# Patient Record
Sex: Female | Born: 1945
Health system: Southern US, Community
[De-identification: ages and names within clinical notes are randomized; demographics above are authoritative.]

## PROBLEM LIST (undated history)

## (undated) DIAGNOSIS — E785 Hyperlipidemia, unspecified: Secondary | ICD-10-CM

## (undated) DIAGNOSIS — F209 Schizophrenia, unspecified: Secondary | ICD-10-CM

## (undated) DIAGNOSIS — E119 Type 2 diabetes mellitus without complications: Secondary | ICD-10-CM

## (undated) DIAGNOSIS — I10 Essential (primary) hypertension: Secondary | ICD-10-CM

## (undated) DIAGNOSIS — N83209 Unspecified ovarian cyst, unspecified side: Secondary | ICD-10-CM

## (undated) HISTORY — PX: ABDOMINAL HYSTERECTOMY: SHX81

## (undated) HISTORY — PX: FRACTURE SURGERY: SHX138

## (undated) HISTORY — PX: TUBAL LIGATION: SHX77

---

## 2003-06-02 ENCOUNTER — Encounter: Payer: Self-pay | Admitting: Internal Medicine

## 2003-06-02 ENCOUNTER — Ambulatory Visit (HOSPITAL_COMMUNITY): Admission: RE | Admit: 2003-06-02 | Discharge: 2003-06-02 | Payer: Self-pay | Admitting: Internal Medicine

## 2005-05-05 ENCOUNTER — Ambulatory Visit (HOSPITAL_COMMUNITY): Admission: RE | Admit: 2005-05-05 | Discharge: 2005-05-05 | Payer: Self-pay | Admitting: Internal Medicine

## 2006-05-15 ENCOUNTER — Ambulatory Visit (HOSPITAL_COMMUNITY): Admission: RE | Admit: 2006-05-15 | Discharge: 2006-05-15 | Payer: Self-pay | Admitting: Internal Medicine

## 2006-07-10 ENCOUNTER — Ambulatory Visit: Payer: Self-pay | Admitting: Gastroenterology

## 2006-07-10 ENCOUNTER — Ambulatory Visit (HOSPITAL_COMMUNITY): Admission: RE | Admit: 2006-07-10 | Discharge: 2006-07-10 | Payer: Self-pay | Admitting: Gastroenterology

## 2007-08-21 ENCOUNTER — Ambulatory Visit (HOSPITAL_COMMUNITY): Admission: RE | Admit: 2007-08-21 | Discharge: 2007-08-21 | Payer: Self-pay | Admitting: Internal Medicine

## 2010-10-26 ENCOUNTER — Ambulatory Visit (HOSPITAL_COMMUNITY)
Admission: RE | Admit: 2010-10-26 | Discharge: 2010-10-26 | Payer: Self-pay | Source: Home / Self Care | Attending: Internal Medicine | Admitting: Internal Medicine

## 2010-11-14 ENCOUNTER — Encounter: Payer: Self-pay | Admitting: Internal Medicine

## 2010-11-15 ENCOUNTER — Encounter: Payer: Self-pay | Admitting: Internal Medicine

## 2011-03-11 NOTE — Op Note (Signed)
NAMEGARYN, Ellen Mcmillan               ACCOUNT NO.:  [id number]   MEDICAL RECORD NO.:  [id number]          PATIENT TYPE:  AMB   LOCATION:  DAY                           FACILITY:  [facility]   PHYSICIAN:  Kassie Mends, M.D.      DATE OF BIRTH:  01-11-1946   DATE OF PROCEDURE:  07/10/2006  DATE OF DISCHARGE:  07/10/2006                                 OPERATIVE REPORT   PROCEDURE:  Colonoscopy.   INDICATION FOR EXAM:  Ellen Mcmillan is a 65 year old female who presents for  average risk colon cancer screening.   FINDINGS:  1. Normal colon.  No polyps, inflammatory changes, or vascular ectasia      seen.  No diverticula evident.  2. Normal retroflexed view of the rectum.   RECOMMENDATIONS:  1. Screening colonoscopy in ten years.  2. Follow up with Dr. Felecia Mcmillan.   MEDICATIONS:  1. Demerol 50 mg IV.  2. Versed 3 mg IV.   PROCEDURE TECHNIQUE:  Physical exam was performed.  Informed consent was  obtained from the patient after explaining the benefits, risks and  alternatives to the procedure.  The patient was connected to the monitor and  placed in the left lateral position.  Continuous oxygen was provided by  nasal cannula and IV medicine administered via indwelling cannula.  After  administration of sedation and rectal exam, the patient's rectum was  intubated and the scope was advanced under direct visualization to the  cecum.  The scope was removed slowly by carefully examining the color,  texture, anatomy and integrity of the mucosa on the way out.  The patient  was recovered in the endoscopy suite and discharged home in satisfactory  condition.      Kassie Mends, M.D.  Electronically Signed     SM/MEDQ  D:  07/11/2006  T:  07/12/2006  Job:  [id number]   cc:   Ellen D. Felecia Shelling, MD  Fax: [phone]

## 2013-03-07 DIAGNOSIS — I10 Essential (primary) hypertension: Secondary | ICD-10-CM | POA: Diagnosis not present

## 2013-10-01 ENCOUNTER — Other Ambulatory Visit (HOSPITAL_COMMUNITY): Payer: Self-pay | Admitting: Internal Medicine

## 2013-10-01 DIAGNOSIS — I10 Essential (primary) hypertension: Secondary | ICD-10-CM | POA: Diagnosis not present

## 2013-10-01 DIAGNOSIS — E7800 Pure hypercholesterolemia, unspecified: Secondary | ICD-10-CM | POA: Diagnosis not present

## 2013-10-01 DIAGNOSIS — Z139 Encounter for screening, unspecified: Secondary | ICD-10-CM

## 2013-10-08 ENCOUNTER — Ambulatory Visit (HOSPITAL_COMMUNITY)
Admission: RE | Admit: 2013-10-08 | Discharge: 2013-10-08 | Disposition: A | Discharge status: Home / Self Care | Payer: Medicare Other | Source: Ambulatory Visit | Attending: Internal Medicine | Admitting: Internal Medicine

## 2013-10-08 DIAGNOSIS — Z1231 Encounter for screening mammogram for malignant neoplasm of breast: Secondary | ICD-10-CM | POA: Insufficient documentation

## 2013-10-08 DIAGNOSIS — Z139 Encounter for screening, unspecified: Secondary | ICD-10-CM

## 2013-10-10 DIAGNOSIS — Z23 Encounter for immunization: Secondary | ICD-10-CM | POA: Diagnosis not present

## 2014-02-22 DIAGNOSIS — R7309 Other abnormal glucose: Secondary | ICD-10-CM | POA: Diagnosis not present

## 2014-02-22 DIAGNOSIS — Z0000 Encounter for general adult medical examination without abnormal findings: Secondary | ICD-10-CM | POA: Diagnosis not present

## 2014-02-22 DIAGNOSIS — E785 Hyperlipidemia, unspecified: Secondary | ICD-10-CM | POA: Diagnosis not present

## 2014-02-22 DIAGNOSIS — R799 Abnormal finding of blood chemistry, unspecified: Secondary | ICD-10-CM | POA: Diagnosis not present

## 2014-02-22 DIAGNOSIS — F209 Schizophrenia, unspecified: Secondary | ICD-10-CM | POA: Diagnosis not present

## 2014-03-03 DIAGNOSIS — E7800 Pure hypercholesterolemia, unspecified: Secondary | ICD-10-CM | POA: Diagnosis not present

## 2014-03-03 DIAGNOSIS — I10 Essential (primary) hypertension: Secondary | ICD-10-CM | POA: Diagnosis not present

## 2014-09-01 DIAGNOSIS — I10 Essential (primary) hypertension: Secondary | ICD-10-CM | POA: Diagnosis not present

## 2014-09-01 DIAGNOSIS — E784 Other hyperlipidemia: Secondary | ICD-10-CM | POA: Diagnosis not present

## 2014-09-01 DIAGNOSIS — Z23 Encounter for immunization: Secondary | ICD-10-CM | POA: Diagnosis not present

## 2014-09-01 DIAGNOSIS — F209 Schizophrenia, unspecified: Secondary | ICD-10-CM | POA: Diagnosis not present

## 2015-03-02 DIAGNOSIS — F209 Schizophrenia, unspecified: Secondary | ICD-10-CM | POA: Diagnosis not present

## 2015-03-02 DIAGNOSIS — E784 Other hyperlipidemia: Secondary | ICD-10-CM | POA: Diagnosis not present

## 2015-03-02 DIAGNOSIS — I10 Essential (primary) hypertension: Secondary | ICD-10-CM | POA: Diagnosis not present

## 2015-05-05 ENCOUNTER — Other Ambulatory Visit (HOSPITAL_COMMUNITY): Payer: Self-pay | Admitting: Internal Medicine

## 2015-05-05 DIAGNOSIS — Z1231 Encounter for screening mammogram for malignant neoplasm of breast: Secondary | ICD-10-CM

## 2015-05-15 ENCOUNTER — Other Ambulatory Visit (HOSPITAL_COMMUNITY): Payer: Self-pay | Admitting: Internal Medicine

## 2015-05-15 ENCOUNTER — Ambulatory Visit (HOSPITAL_COMMUNITY)
Admission: RE | Admit: 2015-05-15 | Discharge: 2015-05-15 | Disposition: A | Discharge status: Home / Self Care | Payer: Medicare Other | Source: Ambulatory Visit | Attending: Internal Medicine | Admitting: Internal Medicine

## 2015-05-15 DIAGNOSIS — Z1231 Encounter for screening mammogram for malignant neoplasm of breast: Secondary | ICD-10-CM

## 2015-08-31 DIAGNOSIS — E785 Hyperlipidemia, unspecified: Secondary | ICD-10-CM | POA: Diagnosis not present

## 2015-08-31 DIAGNOSIS — F209 Schizophrenia, unspecified: Secondary | ICD-10-CM | POA: Diagnosis not present

## 2015-08-31 DIAGNOSIS — E784 Other hyperlipidemia: Secondary | ICD-10-CM | POA: Diagnosis not present

## 2015-08-31 DIAGNOSIS — Z23 Encounter for immunization: Secondary | ICD-10-CM | POA: Diagnosis not present

## 2015-08-31 DIAGNOSIS — I10 Essential (primary) hypertension: Secondary | ICD-10-CM | POA: Diagnosis not present

## 2016-02-29 DIAGNOSIS — F209 Schizophrenia, unspecified: Secondary | ICD-10-CM | POA: Diagnosis not present

## 2016-02-29 DIAGNOSIS — I10 Essential (primary) hypertension: Secondary | ICD-10-CM | POA: Diagnosis not present

## 2016-02-29 DIAGNOSIS — E784 Other hyperlipidemia: Secondary | ICD-10-CM | POA: Diagnosis not present

## 2016-03-08 ENCOUNTER — Encounter: Payer: Self-pay | Admitting: Obstetrics and Gynecology

## 2016-03-10 ENCOUNTER — Encounter: Payer: Self-pay | Admitting: *Deleted

## 2016-03-10 ENCOUNTER — Encounter: Payer: Self-pay | Admitting: Obstetrics and Gynecology

## 2016-05-24 ENCOUNTER — Telehealth: Payer: Self-pay | Admitting: Gastroenterology

## 2016-05-24 NOTE — Telephone Encounter (Signed)
Recall for tcs °

## 2016-05-25 NOTE — Telephone Encounter (Signed)
Letter mail

## 2016-07-04 DIAGNOSIS — E784 Other hyperlipidemia: Secondary | ICD-10-CM | POA: Diagnosis not present

## 2016-07-04 DIAGNOSIS — I10 Essential (primary) hypertension: Secondary | ICD-10-CM | POA: Diagnosis not present

## 2016-07-04 DIAGNOSIS — Z23 Encounter for immunization: Secondary | ICD-10-CM | POA: Diagnosis not present

## 2016-07-04 DIAGNOSIS — F209 Schizophrenia, unspecified: Secondary | ICD-10-CM | POA: Diagnosis not present

## 2016-07-27 DIAGNOSIS — I10 Essential (primary) hypertension: Secondary | ICD-10-CM | POA: Diagnosis not present

## 2016-07-27 DIAGNOSIS — F209 Schizophrenia, unspecified: Secondary | ICD-10-CM | POA: Diagnosis not present

## 2016-07-27 DIAGNOSIS — R140 Abdominal distension (gaseous): Secondary | ICD-10-CM | POA: Diagnosis not present

## 2016-08-19 ENCOUNTER — Telehealth: Payer: Self-pay

## 2016-08-19 DIAGNOSIS — B370 Candidal stomatitis: Secondary | ICD-10-CM | POA: Diagnosis not present

## 2016-08-19 DIAGNOSIS — F209 Schizophrenia, unspecified: Secondary | ICD-10-CM | POA: Diagnosis not present

## 2016-08-19 DIAGNOSIS — I10 Essential (primary) hypertension: Secondary | ICD-10-CM | POA: Diagnosis not present

## 2016-08-24 NOTE — Telephone Encounter (Signed)
Ok to scheduled

## 2016-08-24 NOTE — Telephone Encounter (Signed)
Gastroenterology Pre-Procedure Review  Request Date: 08/19/2016 Requesting Physician: Dr. Felecia ShellingFanta  PATIENT REVIEW QUESTIONS: The patient responded to the following health history questions as indicated:    1. Diabetes Melitis: no 2. Joint replacements in the past 12 months: no 3. Major health problems in the past 3 months: no 4. Has an artificial valve or MVP: no 5. Has a defibrillator: no 6. Has been advised in past to take antibiotics in advance of a procedure like teeth cleaning: no 7. Family history of colon cancer: no  8. Alcohol Use: no 9. History of sleep apnea: no  10. History of coronary artery or other vascular stents placed within the last 12 months: no    MEDICATIONS & ALLERGIES:    Patient reports the following regarding taking any blood thinners:   Plavix? no Aspirin? no Coumadin? no Brilinta? no Xarelto? no Eliquis? no Pradaxa? no Savaysa? no Effient? no  Patient confirms/reports the following medications:  Current Outpatient Prescriptions  Medication Sig Dispense Refill  . acetaminophen (TYLENOL) 325 MG tablet Take 650 mg by mouth every 6 (six) hours as needed.    Marland Kitchen. amLODipine (NORVASC) 10 MG tablet Take 10 mg by mouth daily.    Marland Kitchen. atenolol (TENORMIN) 50 MG tablet Take 50 mg by mouth daily.    Marland Kitchen. docusate sodium (COLACE) 100 MG capsule Take 100 mg by mouth 2 (two) times daily.    Marland Kitchen. losartan-hydrochlorothiazide (HYZAAR) 100-25 MG tablet Take 1 tablet by mouth daily.    . Multiple Vitamin (MULTIVITAMIN) tablet Take 1 tablet by mouth daily.    . simvastatin (ZOCOR) 20 MG tablet Take 20 mg by mouth daily.    . ziprasidone (GEODON) 60 MG capsule Take 60 mg by mouth 2 (two) times daily with a meal.     No current facility-administered medications for this visit.     Patient confirms/reports the following allergies:  No Known Allergies  No orders of the defined types were placed in this encounter.   AUTHORIZATION INFORMATION Primary Insurance: ID #:  Group #:   Pre-Cert / Auth required:  Pre-Cert / Auth #:   Secondary Insurance:   ID #: Group #:  Pre-Cert / Auth required: Pre-Cert / Auth #:   SCHEDULE INFORMATION: Procedure has been scheduled as follows:  Date: 09/07/2016                         Time:  1:00 PM Location: [facility] Short Stay  This Gastroenterology Pre-Precedure Review Form is being routed to the following provider(s): R. Roetta SessionsMichael Rourk, MD

## 2016-08-24 NOTE — Telephone Encounter (Signed)
Received a call from Dr. Letitia NeriFanta's office to set up this pt for colonoscopy while she was at their office.   I spoke to ChinaBenita and Ida and got most of the information from the caregiver, Hermine MessickMelissa Sheffield, who si the daughter in law of the pt and owns the facility that the pt resides in.  PT does not have a POA, Melissa said that the pt signs for herself, but Efraim KaufmannMelissa said that she will be able to be with the pt at the hospital.

## 2016-08-25 NOTE — Telephone Encounter (Signed)
I called to speak to [facility] whom I spoke to when she had the pt at Dr. Letitia NeriFanta's.  I will need to change the appt from 11/15/ to 11/16. Melissa was not in and [name] answered the phone ( Melissa's mother). She refused to give me a phone number that I could contact Melissa. She kept telling me that we needed to check the insurance and I told her that they need to check the insurance. I asked her to have Melissa call me and she never said that she would, only wanted my name.

## 2016-09-02 NOTE — Telephone Encounter (Signed)
I called to [facility] to [facility] in reference to the date of the procedure. I need to reschedule and Ellen's mom would not give me a number to call Ellen. So I asked her to tell Ellen that the appt for the colonoscopy is cancelled and to please call me to reschedule. She said she gave her the message the last time, but I have not heard from her.

## 2016-09-02 NOTE — Telephone Encounter (Signed)
Forwarding to Leslie Lewis, PA for FYI.  

## 2016-09-06 NOTE — Telephone Encounter (Signed)
I called and spoke to IDA and she is aware pt is not scheduled for colonoscopy now.

## 2016-09-06 NOTE — Telephone Encounter (Signed)
Noted. Please make PCP aware that caregiver's will not return call and patient currently not on schedule for TCS.

## 2016-09-19 ENCOUNTER — Telehealth (HOSPITAL_COMMUNITY): Payer: Self-pay | Admitting: *Deleted

## 2016-09-19 NOTE — Telephone Encounter (Signed)
attempt to reach patient regarding an appointment. line busy.

## 2016-09-26 ENCOUNTER — Telehealth: Payer: Self-pay

## 2016-09-26 NOTE — Telephone Encounter (Signed)
[phone] please call to schedule tcs, received letter

## 2016-10-03 ENCOUNTER — Other Ambulatory Visit (HOSPITAL_COMMUNITY): Payer: Self-pay | Admitting: Internal Medicine

## 2016-10-03 DIAGNOSIS — E784 Other hyperlipidemia: Secondary | ICD-10-CM | POA: Diagnosis not present

## 2016-10-03 DIAGNOSIS — I10 Essential (primary) hypertension: Secondary | ICD-10-CM | POA: Diagnosis not present

## 2016-10-03 DIAGNOSIS — Z780 Asymptomatic menopausal state: Secondary | ICD-10-CM

## 2016-10-03 DIAGNOSIS — F209 Schizophrenia, unspecified: Secondary | ICD-10-CM | POA: Diagnosis not present

## 2016-10-03 DIAGNOSIS — Z23 Encounter for immunization: Secondary | ICD-10-CM | POA: Diagnosis not present

## 2016-10-03 NOTE — Telephone Encounter (Signed)
Tried to call and line was busy.  

## 2016-10-04 NOTE — Telephone Encounter (Signed)
I called and left message with [name] to have Ellen Mcmillan to call me to give triage info and med list for pt to be triaged for the colonoscopy.   She said she will do so.

## 2016-10-05 NOTE — Telephone Encounter (Signed)
[facility] call with med and insurance information to schedule the colonoscopy.

## 2016-10-11 ENCOUNTER — Other Ambulatory Visit (HOSPITAL_COMMUNITY): Payer: Medicare Other

## 2016-10-13 ENCOUNTER — Emergency Department (HOSPITAL_COMMUNITY)
Admission: EM | Admit: 2016-10-13 | Discharge: 2016-10-13 | Disposition: A | Discharge status: Home / Self Care | Payer: Medicare Other | Attending: Emergency Medicine | Admitting: Emergency Medicine

## 2016-10-13 ENCOUNTER — Encounter (HOSPITAL_COMMUNITY): Payer: Self-pay | Admitting: Emergency Medicine

## 2016-10-13 DIAGNOSIS — F419 Anxiety disorder, unspecified: Secondary | ICD-10-CM | POA: Insufficient documentation

## 2016-10-13 DIAGNOSIS — Z79899 Other long term (current) drug therapy: Secondary | ICD-10-CM | POA: Insufficient documentation

## 2016-10-13 DIAGNOSIS — I10 Essential (primary) hypertension: Secondary | ICD-10-CM | POA: Diagnosis not present

## 2016-10-13 DIAGNOSIS — E119 Type 2 diabetes mellitus without complications: Secondary | ICD-10-CM | POA: Diagnosis not present

## 2016-10-13 DIAGNOSIS — R102 Pelvic and perineal pain: Secondary | ICD-10-CM | POA: Insufficient documentation

## 2016-10-13 DIAGNOSIS — R4182 Altered mental status, unspecified: Secondary | ICD-10-CM | POA: Diagnosis present

## 2016-10-13 HISTORY — DX: Essential (primary) hypertension: I10

## 2016-10-13 HISTORY — DX: Unspecified ovarian cyst, unspecified side: N83.209

## 2016-10-13 HISTORY — DX: Schizophrenia, unspecified: F20.9

## 2016-10-13 HISTORY — DX: Type 2 diabetes mellitus without complications: E11.9

## 2016-10-13 LAB — COMPREHENSIVE METABOLIC PANEL
ALT: 31 U/L (ref 14–54)
ANION GAP: 11 (ref 5–15)
AST: 45 U/L — ABNORMAL HIGH (ref 15–41)
Albumin: 4.5 g/dL (ref 3.5–5.0)
Alkaline Phosphatase: 67 U/L (ref 38–126)
BUN: 11 mg/dL (ref 6–20)
CHLORIDE: 102 mmol/L (ref 101–111)
CO2: 26 mmol/L (ref 22–32)
Calcium: 9.7 mg/dL (ref 8.9–10.3)
Creatinine, Ser: 0.89 mg/dL (ref 0.44–1.00)
Glucose, Bld: 105 mg/dL — ABNORMAL HIGH (ref 65–99)
POTASSIUM: 3.5 mmol/L (ref 3.5–5.1)
SODIUM: 139 mmol/L (ref 135–145)
Total Bilirubin: 0.9 mg/dL (ref 0.3–1.2)
Total Protein: 8.2 g/dL — ABNORMAL HIGH (ref 6.5–8.1)

## 2016-10-13 LAB — CBC
HEMATOCRIT: 39.4 % (ref 36.0–46.0)
HEMOGLOBIN: 13.2 g/dL (ref 12.0–15.0)
MCH: 31.7 pg (ref 26.0–34.0)
MCHC: 33.5 g/dL (ref 30.0–36.0)
MCV: 94.5 fL (ref 78.0–100.0)
PLATELETS: 331 10*3/uL (ref 150–400)
RBC: 4.17 MIL/uL (ref 3.87–5.11)
RDW: 12.1 % (ref 11.5–15.5)
WBC: 8.4 10*3/uL (ref 4.0–10.5)

## 2016-10-13 LAB — URINALYSIS, ROUTINE W REFLEX MICROSCOPIC
Bilirubin Urine: NEGATIVE
GLUCOSE, UA: NEGATIVE mg/dL
Hgb urine dipstick: NEGATIVE
Ketones, ur: NEGATIVE mg/dL
LEUKOCYTES UA: NEGATIVE
Nitrite: NEGATIVE
PH: 7 (ref 5.0–8.0)
PROTEIN: NEGATIVE mg/dL
SPECIFIC GRAVITY, URINE: 1.006 (ref 1.005–1.030)

## 2016-10-13 MED ORDER — HYDROXYZINE HCL 25 MG PO TABS: 25.0000 mg | ORAL_TABLET | Freq: Four times a day (QID) | ORAL | 0 refills | Status: DC

## 2016-10-13 MED ORDER — HYDROXYZINE HCL 25 MG PO TABS
25.0000 mg | ORAL_TABLET | Freq: Once | ORAL | Status: AC
  Administered 2016-10-13: 25 mg via ORAL
  Filled 2016-10-13: qty 1

## 2016-10-13 NOTE — ED Provider Notes (Signed)
[facility]-EMERGENCY DEPT Provider Note   CSN: [id number] Arrival date & time: 10/13/16  1912  By signing my name below, I, Nelwyn SalisburyJoshua Fowler, attest that this documentation has been prepared under the direction and in the presence of Mancel BaleElliott Benjimen Kelley, MD . Electronically Signed: Nelwyn SalisburyJoshua Fowler, Scribe. 10/13/2016. 9:11 PM.  History   Chief Complaint Chief Complaint  Patient presents with  . Altered Mental Status   The history is provided by the patient, a relative and a caregiver. No language interpreter was used.    HPI Comments:  Ellen RenshawHelen M Mcmillan is a 70 y.o. female with pmhx of DM, HTN, and schizophrenia who presents to the Emergency Department with ex-daughter-in-law, her caretaker, complaining of gradual onset constant worsening changes in mental status beginning a few days ago. Pt's relative states that she is an Production designer, theatre/television/filmadministrator and an LPN at the home where the pt stays. She notes that the pt has been compliant with her medication but has been swearing, becoming irritable,  And has become loud and boisterous. No modifying factors indicated. She reports associated decreased concentration from the pt. Pt's relative denies any other symptoms. Pt has been trying to get in to [facility] but the most recent appointment is not until next year.   Pt secondarily complains of sudden-onset constant vaginal itch and pain beginning a few days ago.   Past Medical History:  Diagnosis Date  . Diabetes mellitus without complication (HCC)   . Hypertension   . Ovarian cyst   . Schizophrenia (HCC)     There are no active problems to display for this patient.   Past Surgical History:  Procedure Laterality Date  . ABDOMINAL HYSTERECTOMY    . FRACTURE SURGERY      OB History    Gravida Para Term Preterm AB Living   1 1 1          SAB TAB Ectopic Multiple Live Births                   Home Medications    Prior to Admission medications   Medication Sig Start Date End Date Taking? Authorizing Provider    acetaminophen (TYLENOL) 325 MG tablet Take 650 mg by mouth every 6 (six) hours as needed.   Yes Historical Provider, MD  amLODipine (NORVASC) 10 MG tablet Take 10 mg by mouth daily.   Yes Historical Provider, MD  atenolol (TENORMIN) 50 MG tablet Take 50 mg by mouth daily.   Yes Historical Provider, MD  docusate sodium (COLACE) 100 MG capsule Take 100 mg by mouth 2 (two) times daily.   Yes Historical Provider, MD  losartan-hydrochlorothiazide (HYZAAR) 100-25 MG tablet Take 1 tablet by mouth daily.   Yes Historical Provider, MD  Multiple Vitamin (MULTIVITAMIN) tablet Take 1 tablet by mouth daily.   Yes Historical Provider, MD  simvastatin (ZOCOR) 20 MG tablet Take 20 mg by mouth daily.   Yes Historical Provider, MD  ziprasidone (GEODON) 60 MG capsule Take 60 mg by mouth 2 (two) times daily with a meal.   Yes Historical Provider, MD  hydrOXYzine (ATARAX/VISTARIL) 25 MG tablet Take 1 tablet (25 mg total) by mouth every 6 (six) hours. 10/13/16   Mancel BaleElliott Quamir Willemsen, MD    Family History Family History  Problem Relation Age of Onset  . Mental illness Sister   . Mental illness Other   . Cancer Other   . Stroke Other     Social History Social History  Substance Use Topics  . Smoking status: Never Smoker  .  Smokeless tobacco: Never Used  . Alcohol use No     Allergies   Patient has no known allergies.   Review of Systems Review of Systems  Genitourinary: Positive for vaginal pain.  Psychiatric/Behavioral: Positive for agitation, behavioral problems and decreased concentration.  All other systems reviewed and are negative.    Physical Exam Updated Vital Signs BP 160/66 (BP Location: Left Arm)   Pulse 68   Temp 97.5 F (36.4 C) (Oral)   Resp 20   Ht 5' 3.5" (1.613 m)   Wt 149 lb (67.6 kg)   SpO2 99%   BMI 25.98 kg/m   Physical Exam  Constitutional: She is oriented to person, place, and time. She appears well-developed and well-nourished.  HENT:  Head: Normocephalic and  atraumatic.  Eyes: Conjunctivae and EOM are normal. Pupils are equal, round, and reactive to light.  Neck: Normal range of motion and phonation normal. Neck supple.  Cardiovascular: Normal rate and regular rhythm.   Pulmonary/Chest: Effort normal and breath sounds normal. She exhibits no tenderness.  Abdominal: Soft. She exhibits no distension. There is no tenderness. There is no guarding.  Musculoskeletal: Normal range of motion.  Neurological: She is alert and oriented to person, place, and time. She exhibits normal muscle tone.  Skin: Skin is warm and dry.  Psychiatric: She has a normal mood and affect. Her behavior is normal. Judgment and thought content normal.  Nursing note and vitals reviewed.    ED Treatments / Results   DIAGNOSTIC STUDIES:  Oxygen Saturation is 100% on RA, normal by my interpretation.    COORDINATION OF CARE:  9:19 PM Discussed treatment plan with pt and caretaker at bedside which includes lab work and they agreed to plan.  10:40 PM Discussed potential short-term treatment with patient and grand-daughter.  Labs (all labs ordered are listed, but only abnormal results are displayed) Labs Reviewed  COMPREHENSIVE METABOLIC PANEL - Abnormal; Notable for the following:       Result Value   Glucose, Bld 105 (*)    Total Protein 8.2 (*)    AST 45 (*)    All other components within normal limits  URINALYSIS, ROUTINE W REFLEX MICROSCOPIC - Abnormal; Notable for the following:    Color, Urine STRAW (*)    All other components within normal limits  CBC    EKG  EKG Interpretation None       Radiology No results found.  Procedures Procedures (including critical care time)  Medications Ordered in ED Medications  hydrOXYzine (ATARAX/VISTARIL) tablet 25 mg (25 mg Oral Given 10/13/16 2252)     Initial Impression / Assessment and Plan / ED Course  I have reviewed the triage vital signs and the nursing notes.  Pertinent labs & imaging results that  were available during my care of the patient were reviewed by me and considered in my medical decision making (see chart for details).  Clinical Course     Medications  hydrOXYzine (ATARAX/VISTARIL) tablet 25 mg (25 mg Oral Given 10/13/16 2252)    Patient Vitals for the past 24 hrs:  BP Temp Temp src Pulse Resp SpO2 Height Weight  10/13/16 2254 160/66 - - 68 20 99 % - -  10/13/16 1917 173/64 97.5 F (36.4 C) Oral 69 20 100 % 5' 3.5" (1.613 m) 149 lb (67.6 kg)    At D/C Reevaluation with update and discussion. After initial assessment and treatment, an updated evaluation reveals she remains comfortable. Findings discussed with patient and caregiver, all  questions answered.Mancel Bale. Nailani Full L    Final Clinical Impressions(s) / ED Diagnoses   Final diagnoses:  Anxiety    Nonspecific anxiety. Patient with ongoing psychiatric illness. No apparent medical cause. SX are mild and can be managed as OP.   Nursing Notes Reviewed/ Care Coordinated Applicable Imaging Reviewed Interpretation of Laboratory Data incorporated into ED treatment  The patient appears reasonably screened and/or stabilized for discharge and I doubt any other medical condition or other [facility] requiring further screening, evaluation, or treatment in the ED at this time prior to discharge.  Plan: Home Medications- continue; Home Treatments- rest; return here if the recommended treatment, does not improve the symptoms; Recommended follow up- Psychiatry asap as OP   New Prescriptions Discharge Medication List as of 10/13/2016 11:01 PM    START taking these medications   Details  hydrOXYzine (ATARAX/VISTARIL) 25 MG tablet Take 1 tablet (25 mg total) by mouth every 6 (six) hours., Starting Thu 10/13/2016, Print      I personally performed the services described in this documentation, which was scribed in my presence. The recorded information has been reviewed and is accurate.       Mancel BaleElliott Lakiyah Arntson, MD 10/14/16 1010

## 2016-10-13 NOTE — Discharge Instructions (Signed)
[facility] for further evaluation and treatment by their psychiatry service.  Start taking the medication, hydroxyzine, tomorrow to help with the anxiety.

## 2016-10-13 NOTE — ED Triage Notes (Signed)
[facility]. Pt has psychiatric hx, has been forgetful, depressed, mood swing, yelling and cursing, sudden onset of crying. Staff reports noticing changes over the past couple of days. Pt says she has noticed some changes for about the last 2 months. [facility] wanted pt checked for possible UTI to see if that was the cause of the pt's mental changes.

## 2016-10-13 NOTE — ED Triage Notes (Signed)
Caregiver requests information be called to her at [phone]. Per caregiver it is ok to leave a message.

## 2016-11-03 ENCOUNTER — Other Ambulatory Visit (HOSPITAL_COMMUNITY): Payer: Medicare Other

## 2016-11-21 DIAGNOSIS — F209 Schizophrenia, unspecified: Secondary | ICD-10-CM | POA: Diagnosis not present

## 2017-02-20 DIAGNOSIS — F209 Schizophrenia, unspecified: Secondary | ICD-10-CM | POA: Diagnosis not present

## 2017-02-20 DIAGNOSIS — I10 Essential (primary) hypertension: Secondary | ICD-10-CM | POA: Diagnosis not present

## 2017-02-20 DIAGNOSIS — E784 Other hyperlipidemia: Secondary | ICD-10-CM | POA: Diagnosis not present

## 2017-03-13 DIAGNOSIS — F209 Schizophrenia, unspecified: Secondary | ICD-10-CM | POA: Diagnosis not present

## 2017-07-03 DIAGNOSIS — F209 Schizophrenia, unspecified: Secondary | ICD-10-CM | POA: Diagnosis not present

## 2017-09-12 ENCOUNTER — Other Ambulatory Visit (HOSPITAL_COMMUNITY): Payer: Self-pay | Admitting: Internal Medicine

## 2017-09-12 DIAGNOSIS — F209 Schizophrenia, unspecified: Secondary | ICD-10-CM | POA: Diagnosis not present

## 2017-09-12 DIAGNOSIS — I10 Essential (primary) hypertension: Secondary | ICD-10-CM | POA: Diagnosis not present

## 2017-09-12 DIAGNOSIS — E7849 Other hyperlipidemia: Secondary | ICD-10-CM | POA: Diagnosis not present

## 2017-09-12 DIAGNOSIS — Z23 Encounter for immunization: Secondary | ICD-10-CM | POA: Diagnosis not present

## 2017-09-12 DIAGNOSIS — Z1231 Encounter for screening mammogram for malignant neoplasm of breast: Secondary | ICD-10-CM

## 2017-10-02 ENCOUNTER — Ambulatory Visit (HOSPITAL_COMMUNITY): Payer: Medicare Other

## 2017-11-15 DIAGNOSIS — F209 Schizophrenia, unspecified: Secondary | ICD-10-CM | POA: Diagnosis not present

## 2017-11-16 ENCOUNTER — Ambulatory Visit (HOSPITAL_COMMUNITY): Payer: Medicare Other

## 2017-11-22 ENCOUNTER — Ambulatory Visit (HOSPITAL_COMMUNITY): Payer: Medicare Other

## 2017-12-25 DIAGNOSIS — Z1331 Encounter for screening for depression: Secondary | ICD-10-CM | POA: Diagnosis not present

## 2017-12-25 DIAGNOSIS — I10 Essential (primary) hypertension: Secondary | ICD-10-CM | POA: Diagnosis not present

## 2017-12-25 DIAGNOSIS — Z1389 Encounter for screening for other disorder: Secondary | ICD-10-CM | POA: Diagnosis not present

## 2017-12-25 DIAGNOSIS — E7849 Other hyperlipidemia: Secondary | ICD-10-CM | POA: Diagnosis not present

## 2017-12-25 DIAGNOSIS — F209 Schizophrenia, unspecified: Secondary | ICD-10-CM | POA: Diagnosis not present

## 2017-12-26 DIAGNOSIS — E7849 Other hyperlipidemia: Secondary | ICD-10-CM | POA: Diagnosis not present

## 2017-12-26 DIAGNOSIS — I10 Essential (primary) hypertension: Secondary | ICD-10-CM | POA: Diagnosis not present

## 2017-12-26 DIAGNOSIS — F209 Schizophrenia, unspecified: Secondary | ICD-10-CM | POA: Diagnosis not present

## 2017-12-26 DIAGNOSIS — R7989 Other specified abnormal findings of blood chemistry: Secondary | ICD-10-CM | POA: Diagnosis not present

## 2017-12-26 DIAGNOSIS — R739 Hyperglycemia, unspecified: Secondary | ICD-10-CM | POA: Diagnosis not present

## 2017-12-26 DIAGNOSIS — Z131 Encounter for screening for diabetes mellitus: Secondary | ICD-10-CM | POA: Diagnosis not present

## 2018-04-03 DIAGNOSIS — F209 Schizophrenia, unspecified: Secondary | ICD-10-CM | POA: Diagnosis not present

## 2018-04-03 DIAGNOSIS — E7849 Other hyperlipidemia: Secondary | ICD-10-CM | POA: Diagnosis not present

## 2018-04-03 DIAGNOSIS — I10 Essential (primary) hypertension: Secondary | ICD-10-CM | POA: Diagnosis not present

## 2018-04-10 DIAGNOSIS — F209 Schizophrenia, unspecified: Secondary | ICD-10-CM | POA: Diagnosis not present

## 2018-08-14 DIAGNOSIS — L089 Local infection of the skin and subcutaneous tissue, unspecified: Secondary | ICD-10-CM | POA: Diagnosis not present

## 2018-08-14 DIAGNOSIS — F209 Schizophrenia, unspecified: Secondary | ICD-10-CM | POA: Diagnosis not present

## 2018-08-14 DIAGNOSIS — I10 Essential (primary) hypertension: Secondary | ICD-10-CM | POA: Diagnosis not present

## 2018-08-14 DIAGNOSIS — Z23 Encounter for immunization: Secondary | ICD-10-CM | POA: Diagnosis not present

## 2018-08-21 ENCOUNTER — Other Ambulatory Visit (HOSPITAL_COMMUNITY): Payer: Self-pay | Admitting: Internal Medicine

## 2018-08-21 ENCOUNTER — Ambulatory Visit (HOSPITAL_COMMUNITY)
Admission: RE | Admit: 2018-08-21 | Discharge: 2018-08-21 | Disposition: A | Discharge status: Home / Self Care | Payer: Medicare Other | Source: Ambulatory Visit | Attending: Internal Medicine | Admitting: Internal Medicine

## 2018-08-21 DIAGNOSIS — M25571 Pain in right ankle and joints of right foot: Secondary | ICD-10-CM | POA: Insufficient documentation

## 2018-09-27 DIAGNOSIS — F209 Schizophrenia, unspecified: Secondary | ICD-10-CM | POA: Diagnosis not present

## 2018-10-08 ENCOUNTER — Ambulatory Visit (INDEPENDENT_AMBULATORY_CARE_PROVIDER_SITE_OTHER): Payer: Medicare Other | Admitting: Orthopedic Surgery

## 2018-10-08 ENCOUNTER — Encounter: Payer: Self-pay | Admitting: Orthopedic Surgery

## 2018-10-08 VITALS — BP 140/61 | HR 73 | Ht 63.0 in | Wt 148.0 lb

## 2018-10-08 DIAGNOSIS — S82841S Displaced bimalleolar fracture of right lower leg, sequela: Secondary | ICD-10-CM

## 2018-10-08 DIAGNOSIS — L03115 Cellulitis of right lower limb: Secondary | ICD-10-CM

## 2018-10-08 MED ORDER — SULFAMETHOXAZOLE-TRIMETHOPRIM 400-80 MG PO TABS: 1.0000 | ORAL_TABLET | Freq: Two times a day (BID) | ORAL | 0 refills | Status: AC

## 2018-10-08 NOTE — Progress Notes (Signed)
NEW PATIENT OFFICE VISIT  Chief Complaint  Patient presents with  . Ankle Pain    Right ankle pain, referred by Dr. Felecia ShellingFanta. [facility]    72 year old female with low IQ presents for evaluation of chronic right ankle ulcer started in October treated with p.o. antibiotics presents now with pain swelling scabbing over the area.  In 1990 she had a bimalleolar fracture with internal fixation using screws only by Dr. Gaynelle CageKeli.  She tolerated that well  However sometime in October she presented to the emergency room with a draining ulcer.  I do not know which antibiotic was used the ulcer did heal over but it healed over with a large eschar and scab.  Mental acuity issue prevents further history   Review of Systems  Unable to perform ROS: Mental acuity     Past Medical History:  Diagnosis Date  . Diabetes mellitus without complication (HCC)   . Hypertension   . Ovarian cyst   . Schizophrenia [facility](HCC)     Past Surgical History:  Procedure Laterality Date  . ABDOMINAL HYSTERECTOMY    . FRACTURE SURGERY      Family History  Problem Relation Age of Onset  . Mental illness Sister   . Mental illness Other   . Cancer Other   . Stroke Other   . Cancer Mother   . Stroke Mother   . Cancer Father    Social History   Tobacco Use  . Smoking status: Never Smoker  . Smokeless tobacco: Never Used  Substance Use Topics  . Alcohol use: No  . Drug use: No    No Known Allergies  No outpatient medications have been marked as taking for the 10/08/18 encounter (Office Visit) with Vickki HearingHarrison, Stanley E, MD.    BP 140/61   Pulse 73   Ht 5\' 3"  (1.6 m)   Wt 148 lb (67.1 kg)   BMI 26.22 kg/m   Physical Exam Appearance is somewhat unkempt she is drooling she is oriented to person only mood and affect pleasant flat  Gait poor Ortho Exam Poor gait pattern instability unsteady gait poor balance  Medial lateral incision of the right ankle with a large area proximal to the  medial malleolus where there is a large hypertrophic scab it is tender to palpation no fluctuance she is a chronic flatfoot deformity with pain less limited subtalar motion and painless ankle motion and ankle joint is stable there is weakness of the posterior tibial tendon skin as described pulse and temperature are normal no edema or swelling chronic nail changes consistent with probably poor vascular flow appears to have normal sensation in the foot no lymphadenopathy noted   MEDICAL DECISION SECTION  [facility]  My independent reading of xrays:  X-rays right ankle interfrag screw malleolus lateral side with large inner malleolar screw and K wire medial side flatfoot deformity  Encounter Diagnoses  Name Primary?  . Closed bimalleolar fracture of right ankle, sequela   . Cellulitis of right ankle Yes    PLAN: (Rx., injectx, surgery, frx, mri/ct) Resume antibiotics trial of p.o. antibiotics if no improvement may require incision and drainage wound care and antibiotics  Return 4 weeks  Meds ordered this encounter  Medications  . sulfamethoxazole-trimethoprim (BACTRIM) 400-80 MG tablet    Sig: Take 1 tablet by mouth 2 (two) times daily.    Dispense:  60 tablet    Refill:  0    Fuller CanadaStanley Harrison, MD  10/08/2018 2:32 PM

## 2018-11-05 ENCOUNTER — Telehealth: Payer: Self-pay | Admitting: Orthopedic Surgery

## 2018-11-05 ENCOUNTER — Ambulatory Visit: Payer: Medicare Other | Admitting: Orthopedic Surgery

## 2018-11-05 NOTE — Telephone Encounter (Signed)
[facility], ph# [phone], regarding patient/resident's follow up appointment scheduled today, 11/05/18, 11:00am (for right ankle cellulitis); spoke with administrator Ms. Sheffield. I offered options for this week - states unable to do those times; needs an afternoon time.  Due to 11/07/18 full schedule, please advise.

## 2018-11-05 NOTE — Telephone Encounter (Signed)
Afternoons are booked (please try to keep 16 patients per clinic) schedule for next available afternoon

## 2018-11-06 NOTE — Telephone Encounter (Signed)
Done on 11/05/18 (re-scheduled accordingly; facilty administrator aware of appointment)

## 2018-11-14 ENCOUNTER — Encounter: Payer: Self-pay | Admitting: Orthopedic Surgery

## 2018-11-14 ENCOUNTER — Ambulatory Visit (INDEPENDENT_AMBULATORY_CARE_PROVIDER_SITE_OTHER): Payer: Medicare Other | Admitting: Orthopedic Surgery

## 2018-11-14 VITALS — BP 158/68 | HR 73 | Ht 63.0 in | Wt 148.0 lb

## 2018-11-14 DIAGNOSIS — S82841S Displaced bimalleolar fracture of right lower leg, sequela: Secondary | ICD-10-CM | POA: Diagnosis not present

## 2018-11-14 DIAGNOSIS — L03115 Cellulitis of right lower limb: Secondary | ICD-10-CM

## 2018-11-14 MED ORDER — SULFAMETHOXAZOLE-TRIMETHOPRIM 400-80 MG PO TABS: 1.0000 | ORAL_TABLET | Freq: Two times a day (BID) | ORAL | 0 refills | Status: DC

## 2018-11-14 NOTE — Progress Notes (Signed)
Chief Complaint  Patient presents with  . Ankle Pain    right     Follow up  Closed bimalleolar fracture of right ankle, sequela Cellulitis of right ankle  (primary encounter diagnosis)  Started bactrim x 4 weeks   Result: The Bactrim seems to have helped with the wound as it has now closed she has less pain and no drainage   73 year old female with low IQ presents for evaluation of chronic right ankle ulcer started in October treated with p.o. antibiotics presents now with pain swelling scabbing over the area.  In 1990 she had a bimalleolar fracture with internal fixation using screws only by Dr. Hilda Lias  She tolerated that well  However sometime in October she presented to the emergency room with a draining ulcer.  I do not know which antibiotic was used the ulcer did heal over but it healed over with a large eschar and scab.  Mental acuity issue prevents further history  Review of Systems  Constitutional: Negative for fever.   BP (!) 158/68   Pulse 73   Ht 5\' 3"  (1.6 m)   Wt 148 lb (67.1 kg)   BMI 26.22 kg/m  Physical Exam Vitals signs and nursing note reviewed.  Constitutional:      Appearance: Normal appearance.  Musculoskeletal:       Feet:  Neurological:     Mental Status: She is alert.  Psychiatric:        Mood and Affect: Mood normal.    Encounter Diagnoses  Name Primary?  . Closed bimalleolar fracture of right ankle, sequela   . Cellulitis of right ankle Yes    Meds ordered this encounter  Medications  . sulfamethoxazole-trimethoprim (BACTRIM) 400-80 MG tablet    Sig: Take 1 tablet by mouth 2 (two) times daily.    Dispense:  56 tablet    Refill:  0    Medication will be refilled and she will follow-up in 4 weeks  I told him if the wound comes back after we stop the antibiotics and we will need to do surgery

## 2018-12-12 ENCOUNTER — Ambulatory Visit: Payer: Medicare Other | Admitting: Orthopedic Surgery

## 2018-12-18 DIAGNOSIS — I10 Essential (primary) hypertension: Secondary | ICD-10-CM | POA: Diagnosis not present

## 2018-12-18 DIAGNOSIS — F209 Schizophrenia, unspecified: Secondary | ICD-10-CM | POA: Diagnosis not present

## 2018-12-18 DIAGNOSIS — E7849 Other hyperlipidemia: Secondary | ICD-10-CM | POA: Diagnosis not present

## 2018-12-24 ENCOUNTER — Ambulatory Visit (INDEPENDENT_AMBULATORY_CARE_PROVIDER_SITE_OTHER): Payer: Medicare Other | Admitting: Orthopedic Surgery

## 2018-12-24 VITALS — BP 123/60 | HR 70 | Ht 63.0 in | Wt 148.0 lb

## 2018-12-24 DIAGNOSIS — M2141 Flat foot [pes planus] (acquired), right foot: Secondary | ICD-10-CM | POA: Diagnosis not present

## 2018-12-24 DIAGNOSIS — S82841S Displaced bimalleolar fracture of right lower leg, sequela: Secondary | ICD-10-CM | POA: Diagnosis not present

## 2018-12-24 DIAGNOSIS — L03115 Cellulitis of right lower limb: Secondary | ICD-10-CM

## 2018-12-24 NOTE — Patient Instructions (Signed)
Recommend Spenco orthotics for pes planus  Stop antibiotics and did not take any unless the wound is draining of course come back to see if his the drainage returns

## 2018-12-24 NOTE — Progress Notes (Signed)
Progress Note   Patient ID: Ellen Mcmillan, female   DOB: 09/10/1946, 73 y.o.   MRN: [id number]   Chief Complaint  Patient presents with  . Follow-up    Recheck on right ankle wound.    73 year old female had internal fixation of the right ankle many years ago developed drainage and recurrent wound.  She was treated with Bactrim wound closed she has a large scar eschar over that area now and complains of posterior malleolar pain behind the medial malleolus    Review of Systems  Constitutional: Negative for fever.     No Known Allergies   BP 123/60   Pulse 70   Ht 5\' 3"  (1.6 m)   Wt 148 lb (67.1 kg)   BMI 26.22 kg/m   Physical Exam Constitutional:      Appearance: Normal appearance.  Neurological:     General: No focal deficit present.     Mental Status: She is alert. Mental status is at baseline.  Psychiatric:        Mood and Affect: Mood normal.    Right ankle Flexible pes planus with posterior tibial tendon tenderness Heel seems flexible Mild heel cord tightness Large eschar over the previously noted wound Dorsiflexion plantarflexion arc is 25 degrees Ankle is stable Distal pulses are intact  Medical decisions:   Data  Imaging:   no  Encounter Diagnoses  Name Primary?  . Closed bimalleolar fracture of right ankle, sequela   . Cellulitis of right ankle   . Pes planus of right foot Yes    PLAN:   Recommend Spenco orthotics for pes planus  Stop antibiotics and did not take any unless the wound is draining of course come back to see if his the drainage returns      Fuller Canada, MD 12/24/2018 2:05 PM

## 2019-05-23 ENCOUNTER — Other Ambulatory Visit: Payer: Self-pay

## 2019-05-27 DIAGNOSIS — Z1389 Encounter for screening for other disorder: Secondary | ICD-10-CM | POA: Diagnosis not present

## 2019-05-27 DIAGNOSIS — Z1331 Encounter for screening for depression: Secondary | ICD-10-CM | POA: Diagnosis not present

## 2019-05-27 DIAGNOSIS — F209 Schizophrenia, unspecified: Secondary | ICD-10-CM | POA: Diagnosis not present

## 2019-05-27 DIAGNOSIS — I10 Essential (primary) hypertension: Secondary | ICD-10-CM | POA: Diagnosis not present

## 2019-05-27 DIAGNOSIS — E785 Hyperlipidemia, unspecified: Secondary | ICD-10-CM | POA: Diagnosis not present

## 2019-05-28 ENCOUNTER — Other Ambulatory Visit: Payer: Self-pay | Admitting: Internal Medicine

## 2019-05-28 ENCOUNTER — Other Ambulatory Visit (HOSPITAL_COMMUNITY): Payer: Self-pay | Admitting: Internal Medicine

## 2019-05-28 DIAGNOSIS — Z780 Asymptomatic menopausal state: Secondary | ICD-10-CM

## 2019-05-28 DIAGNOSIS — Z1231 Encounter for screening mammogram for malignant neoplasm of breast: Secondary | ICD-10-CM

## 2019-06-07 ENCOUNTER — Other Ambulatory Visit (HOSPITAL_COMMUNITY): Payer: Medicare Other

## 2019-06-07 ENCOUNTER — Inpatient Hospital Stay (HOSPITAL_COMMUNITY): Admission: RE | Admit: 2019-06-07 | Payer: Medicare Other | Source: Ambulatory Visit

## 2019-06-27 DIAGNOSIS — I10 Essential (primary) hypertension: Secondary | ICD-10-CM | POA: Diagnosis not present

## 2019-06-27 DIAGNOSIS — E7849 Other hyperlipidemia: Secondary | ICD-10-CM | POA: Diagnosis not present

## 2019-07-05 DIAGNOSIS — Z0001 Encounter for general adult medical examination with abnormal findings: Secondary | ICD-10-CM | POA: Diagnosis not present

## 2019-07-05 DIAGNOSIS — I10 Essential (primary) hypertension: Secondary | ICD-10-CM | POA: Diagnosis not present

## 2019-07-27 DIAGNOSIS — F209 Schizophrenia, unspecified: Secondary | ICD-10-CM | POA: Diagnosis not present

## 2019-07-27 DIAGNOSIS — I10 Essential (primary) hypertension: Secondary | ICD-10-CM | POA: Diagnosis not present

## 2019-10-03 DIAGNOSIS — F209 Schizophrenia, unspecified: Secondary | ICD-10-CM | POA: Diagnosis not present

## 2019-10-21 ENCOUNTER — Ambulatory Visit (HOSPITAL_COMMUNITY): Payer: Medicare Other

## 2019-10-30 ENCOUNTER — Ambulatory Visit (HOSPITAL_COMMUNITY)
Admission: RE | Admit: 2019-10-30 | Discharge: 2019-10-30 | Disposition: A | Discharge status: Home / Self Care | Payer: Medicare Other | Source: Ambulatory Visit | Attending: Internal Medicine | Admitting: Internal Medicine

## 2019-10-30 ENCOUNTER — Other Ambulatory Visit: Payer: Self-pay

## 2019-10-30 DIAGNOSIS — Z1231 Encounter for screening mammogram for malignant neoplasm of breast: Secondary | ICD-10-CM | POA: Diagnosis not present

## 2019-11-07 DIAGNOSIS — E7849 Other hyperlipidemia: Secondary | ICD-10-CM | POA: Diagnosis not present

## 2019-11-07 DIAGNOSIS — I10 Essential (primary) hypertension: Secondary | ICD-10-CM | POA: Diagnosis not present

## 2019-12-08 DIAGNOSIS — F209 Schizophrenia, unspecified: Secondary | ICD-10-CM | POA: Diagnosis not present

## 2019-12-08 DIAGNOSIS — I10 Essential (primary) hypertension: Secondary | ICD-10-CM | POA: Diagnosis not present

## 2019-12-24 ENCOUNTER — Ambulatory Visit: Payer: Medicare Other | Attending: Internal Medicine

## 2019-12-24 DIAGNOSIS — Z23 Encounter for immunization: Secondary | ICD-10-CM | POA: Insufficient documentation

## 2019-12-24 NOTE — Progress Notes (Signed)
   Covid-19 Vaccination Clinic  Name:  Ellen Mcmillan    MRN: [id number] DOB: [birth year]  12/24/2019  [name] was observed post Covid-19 immunization for 15 minutes without incident. She was provided with Vaccine Information Sheet and instruction to access the V-Safe system.   [name] was instructed to call 911 with any severe reactions post vaccine: Marland Kitchen Difficulty breathing  . Swelling of face and throat  . A fast heartbeat  . A bad rash all over body  . Dizziness and weakness   Immunizations Administered    Name Date Dose VIS Date Route   Moderna COVID-19 Vaccine 12/24/2019  2:16 PM 0.5 mL 09/24/2019 Intramuscular   Manufacturer: Moderna   Lot: [id number]   NDC: 02301-720-91

## 2020-01-05 DIAGNOSIS — I10 Essential (primary) hypertension: Secondary | ICD-10-CM | POA: Diagnosis not present

## 2020-01-05 DIAGNOSIS — E785 Hyperlipidemia, unspecified: Secondary | ICD-10-CM | POA: Diagnosis not present

## 2020-01-21 ENCOUNTER — Ambulatory Visit: Payer: Medicare Other | Attending: Internal Medicine

## 2020-01-21 DIAGNOSIS — Z23 Encounter for immunization: Secondary | ICD-10-CM

## 2020-01-21 NOTE — Progress Notes (Signed)
   Covid-19 Vaccination Clinic  Name:  YULISA CHIRICO    MRN: [id number] DOB: [birth year]  01/21/2020  Ms. Olenick was observed post Covid-19 immunization for 15 minutes without incident. She was provided with Vaccine Information Sheet and instruction to access the V-Safe system.   Ms. Provencher was instructed to call 911 with any severe reactions post vaccine: Marland Kitchen Difficulty breathing  . Swelling of face and throat  . A fast heartbeat  . A bad rash all over body  . Dizziness and weakness   Immunizations Administered    Name Date Dose VIS Date Route   Moderna COVID-19 Vaccine 01/21/2020  1:14 PM 0.5 mL 09/24/2019 Intramuscular   Manufacturer: Moderna   Lot: [id number]   NDC: 87276-184-85

## 2020-02-05 DIAGNOSIS — E785 Hyperlipidemia, unspecified: Secondary | ICD-10-CM | POA: Diagnosis not present

## 2020-02-05 DIAGNOSIS — I10 Essential (primary) hypertension: Secondary | ICD-10-CM | POA: Diagnosis not present

## 2020-03-02 DIAGNOSIS — N1831 Chronic kidney disease, stage 3a: Secondary | ICD-10-CM | POA: Diagnosis not present

## 2020-03-02 DIAGNOSIS — I10 Essential (primary) hypertension: Secondary | ICD-10-CM | POA: Diagnosis not present

## 2020-03-02 DIAGNOSIS — S91001D Unspecified open wound, right ankle, subsequent encounter: Secondary | ICD-10-CM | POA: Diagnosis not present

## 2020-03-02 DIAGNOSIS — F209 Schizophrenia, unspecified: Secondary | ICD-10-CM | POA: Diagnosis not present

## 2020-03-19 DIAGNOSIS — F209 Schizophrenia, unspecified: Secondary | ICD-10-CM | POA: Diagnosis not present

## 2020-03-26 ENCOUNTER — Other Ambulatory Visit: Payer: Self-pay

## 2020-03-26 ENCOUNTER — Encounter: Payer: Self-pay | Admitting: Orthopedic Surgery

## 2020-03-26 ENCOUNTER — Ambulatory Visit (INDEPENDENT_AMBULATORY_CARE_PROVIDER_SITE_OTHER): Payer: Medicare Other | Admitting: Orthopedic Surgery

## 2020-03-26 ENCOUNTER — Ambulatory Visit: Payer: Medicare Other

## 2020-03-26 VITALS — BP 123/56 | HR 71 | Ht 64.0 in

## 2020-03-26 DIAGNOSIS — S91001S Unspecified open wound, right ankle, sequela: Secondary | ICD-10-CM

## 2020-03-26 DIAGNOSIS — M25571 Pain in right ankle and joints of right foot: Secondary | ICD-10-CM

## 2020-03-26 DIAGNOSIS — L03115 Cellulitis of right lower limb: Secondary | ICD-10-CM

## 2020-03-26 MED ORDER — SULFAMETHOXAZOLE-TRIMETHOPRIM 400-80 MG PO TABS: 1.0000 | ORAL_TABLET | Freq: Two times a day (BID) | ORAL | 0 refills | Status: DC

## 2020-03-26 NOTE — Patient Instructions (Signed)
Referral made to Dr Lajoyce Corners   Start antibiotics

## 2020-03-26 NOTE — Progress Notes (Signed)
Chief Complaint  Patient presents with  . Follow-up    Recheck on right ankle wound    74 year old female schizophrenia lives in Adult home care with DM had ORIF with a interfrag screw of the lateral malleolus and a malleolar screw and K wire medial malleolus right ankle in 1990.  Presented and 2019With painful ankle which resolved with Bactrim  Presents now with large eschar over the medial malleolus and some purulent drainage and pain  Review of Systems  Constitutional: Negative for chills and fever.  All other systems reviewed and are negative.  Past Medical History:  Diagnosis Date  . Diabetes mellitus without complication (HCC)   . Hypertension   . Ovarian cyst   . Schizophrenia [facility])    Past Surgical History:  Procedure Laterality Date  . ABDOMINAL HYSTERECTOMY    . FRACTURE SURGERY     BP (!) 123/56   Pulse 71   Ht 5\' 4"  (1.626 m)   BMI 25.40 kg/m   Physical Exam Constitutional:      General: She is not in acute distress.    Appearance: Normal appearance. She is normal weight. She is not ill-appearing, toxic-appearing or diaphoretic.  Eyes:     General: No scleral icterus.       Right eye: No discharge.        Left eye: No discharge.     Extraocular Movements: Extraocular movements intact.     Pupils: Pupils are equal, round, and reactive to light.  Cardiovascular:     Rate and Rhythm: Normal rate.  Neurological:     Mental Status: She is alert. Mental status is at baseline.     Sensory: No sensory deficit.     Gait: Gait normal.  Psychiatric:        Mood and Affect: Mood normal.     Medial side right ankle  Large eschar medial side of the ankle see media section in the chart.  Warmth drainage plantigrade foot sensory exam appears intact adequate blood flow to the foot terms of color and temperature palpable dorsalis pedis pulse  X-ray ankle mortise is intact screws are intact there is a periosteal reaction along the medial malleolus  Meds ordered this  encounter  Medications  . sulfamethoxazole-trimethoprim (BACTRIM) 400-80 MG tablet    Sig: Take 1 tablet by mouth 2 (two) times daily.    Dispense:  56 tablet    Refill:  0    Start Bactrim and we have made a referral to Dr. for definitive care

## 2020-04-02 ENCOUNTER — Ambulatory Visit: Payer: Medicare Other | Admitting: Orthopedic Surgery

## 2020-04-02 DIAGNOSIS — I10 Essential (primary) hypertension: Secondary | ICD-10-CM | POA: Diagnosis not present

## 2020-04-02 DIAGNOSIS — F209 Schizophrenia, unspecified: Secondary | ICD-10-CM | POA: Diagnosis not present

## 2020-04-13 ENCOUNTER — Ambulatory Visit: Payer: Medicare Other | Admitting: Orthopedic Surgery

## 2020-04-20 ENCOUNTER — Ambulatory Visit: Payer: Medicare Other | Admitting: Orthopedic Surgery

## 2020-04-29 ENCOUNTER — Telehealth: Payer: Self-pay | Admitting: Radiology

## 2020-04-29 NOTE — Telephone Encounter (Signed)
Patient no show for appointments with Dr Lupita Leash only

## 2020-06-02 DIAGNOSIS — F209 Schizophrenia, unspecified: Secondary | ICD-10-CM | POA: Diagnosis not present

## 2020-06-02 DIAGNOSIS — E7849 Other hyperlipidemia: Secondary | ICD-10-CM | POA: Diagnosis not present

## 2020-07-03 DIAGNOSIS — F209 Schizophrenia, unspecified: Secondary | ICD-10-CM | POA: Diagnosis not present

## 2020-07-03 DIAGNOSIS — E7849 Other hyperlipidemia: Secondary | ICD-10-CM | POA: Diagnosis not present

## 2020-08-10 DIAGNOSIS — I10 Essential (primary) hypertension: Secondary | ICD-10-CM | POA: Diagnosis not present

## 2020-08-10 DIAGNOSIS — F209 Schizophrenia, unspecified: Secondary | ICD-10-CM | POA: Diagnosis not present

## 2020-09-09 IMAGING — DX DG ANKLE COMPLETE 3+V*R*
3 series · 3 of 3 positions shown · non-contrast
Comparison: None.

CLINICAL DATA: Right ankle pain for the past 2 weeks.

EXAM:
RIGHT ANKLE - COMPLETE 3+ VIEW

[ankle ap]
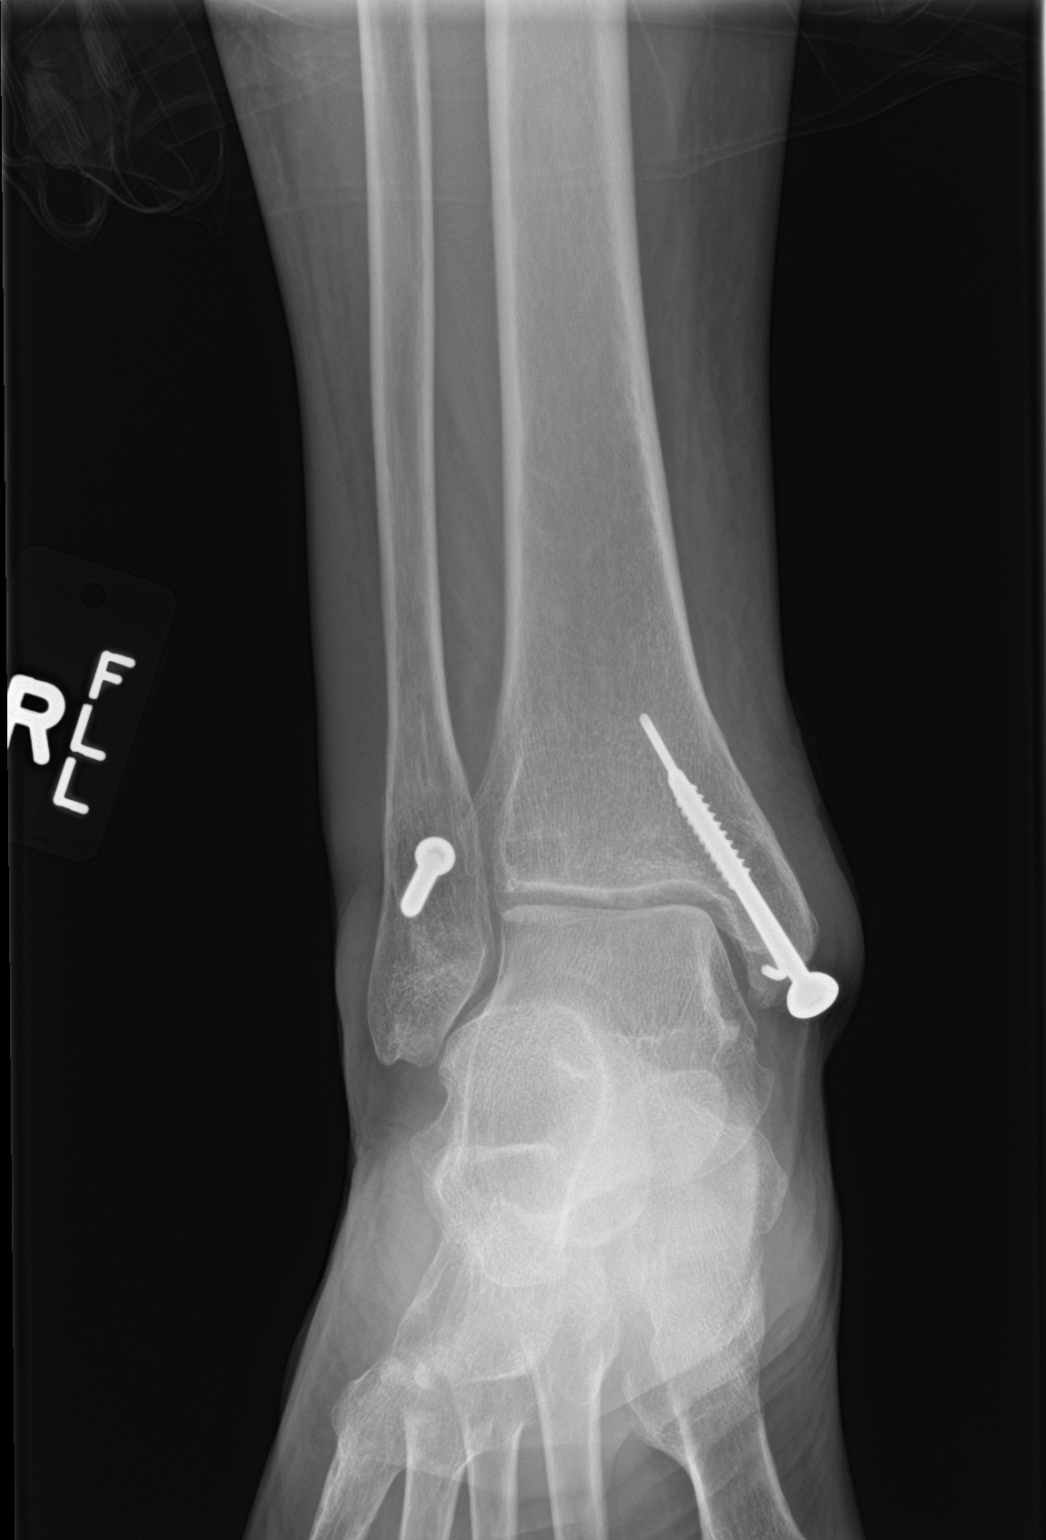

[ankle lat]
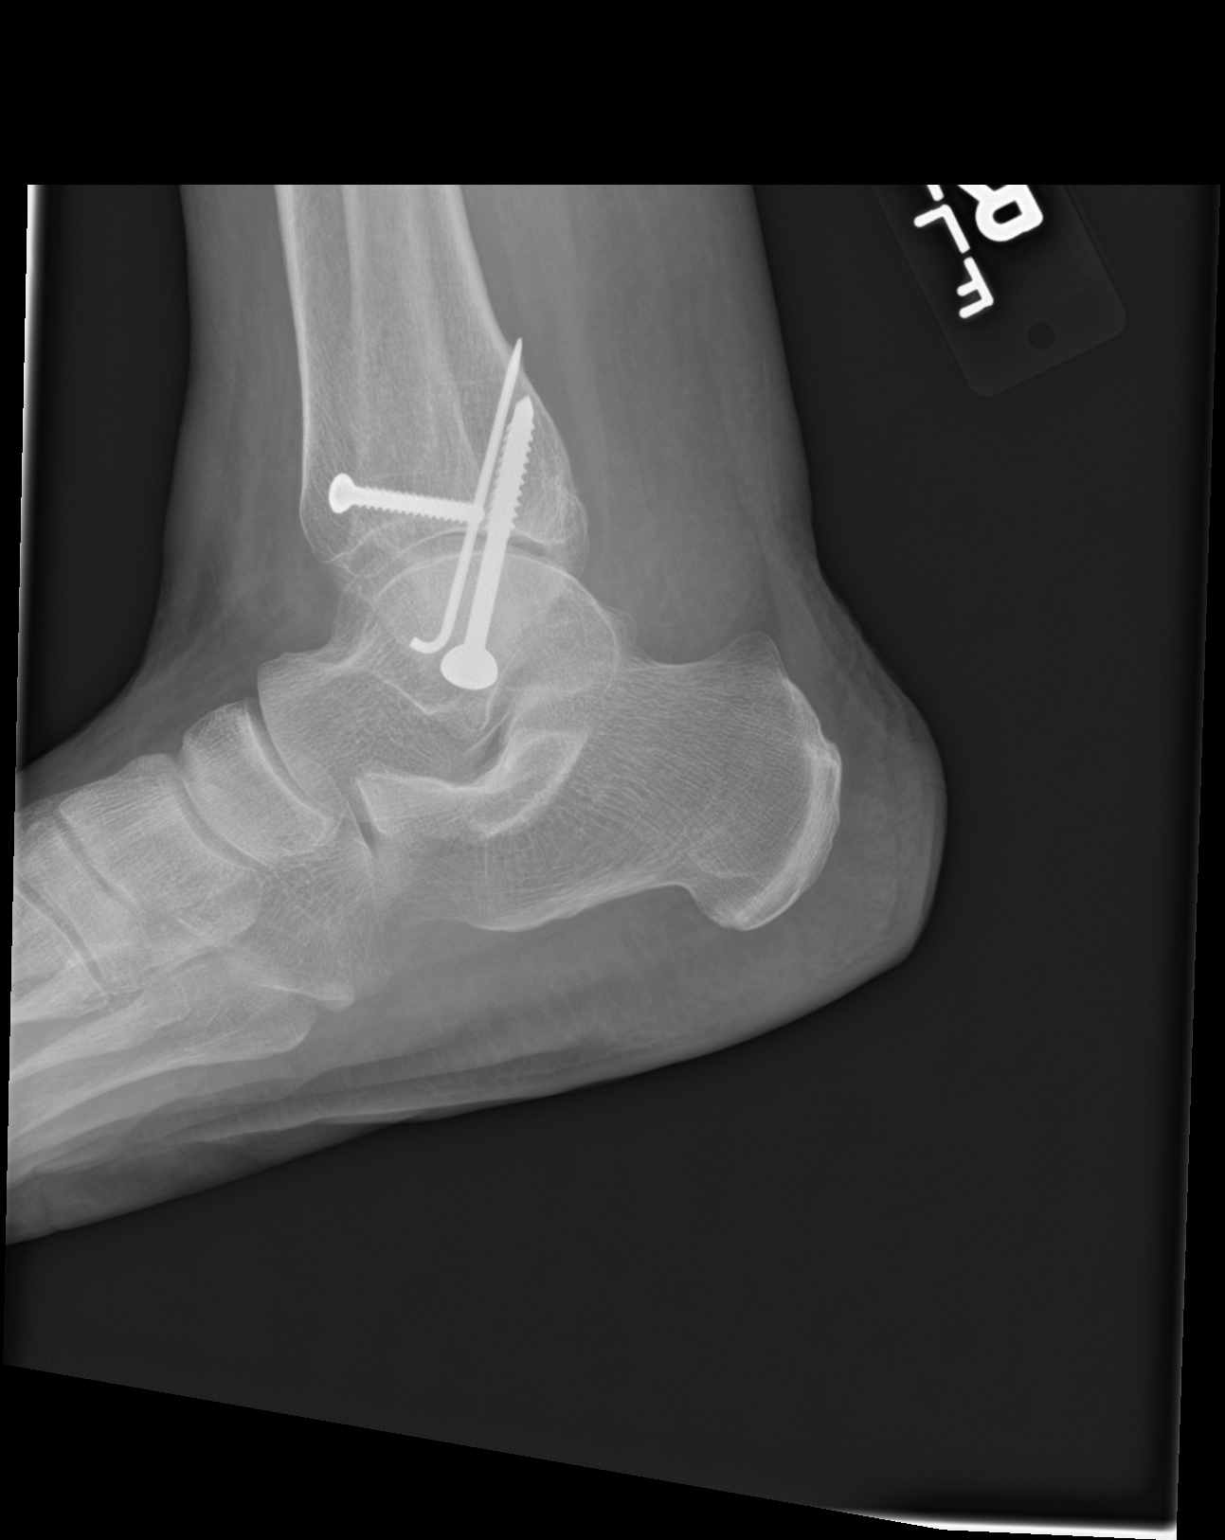

[ankle obl]
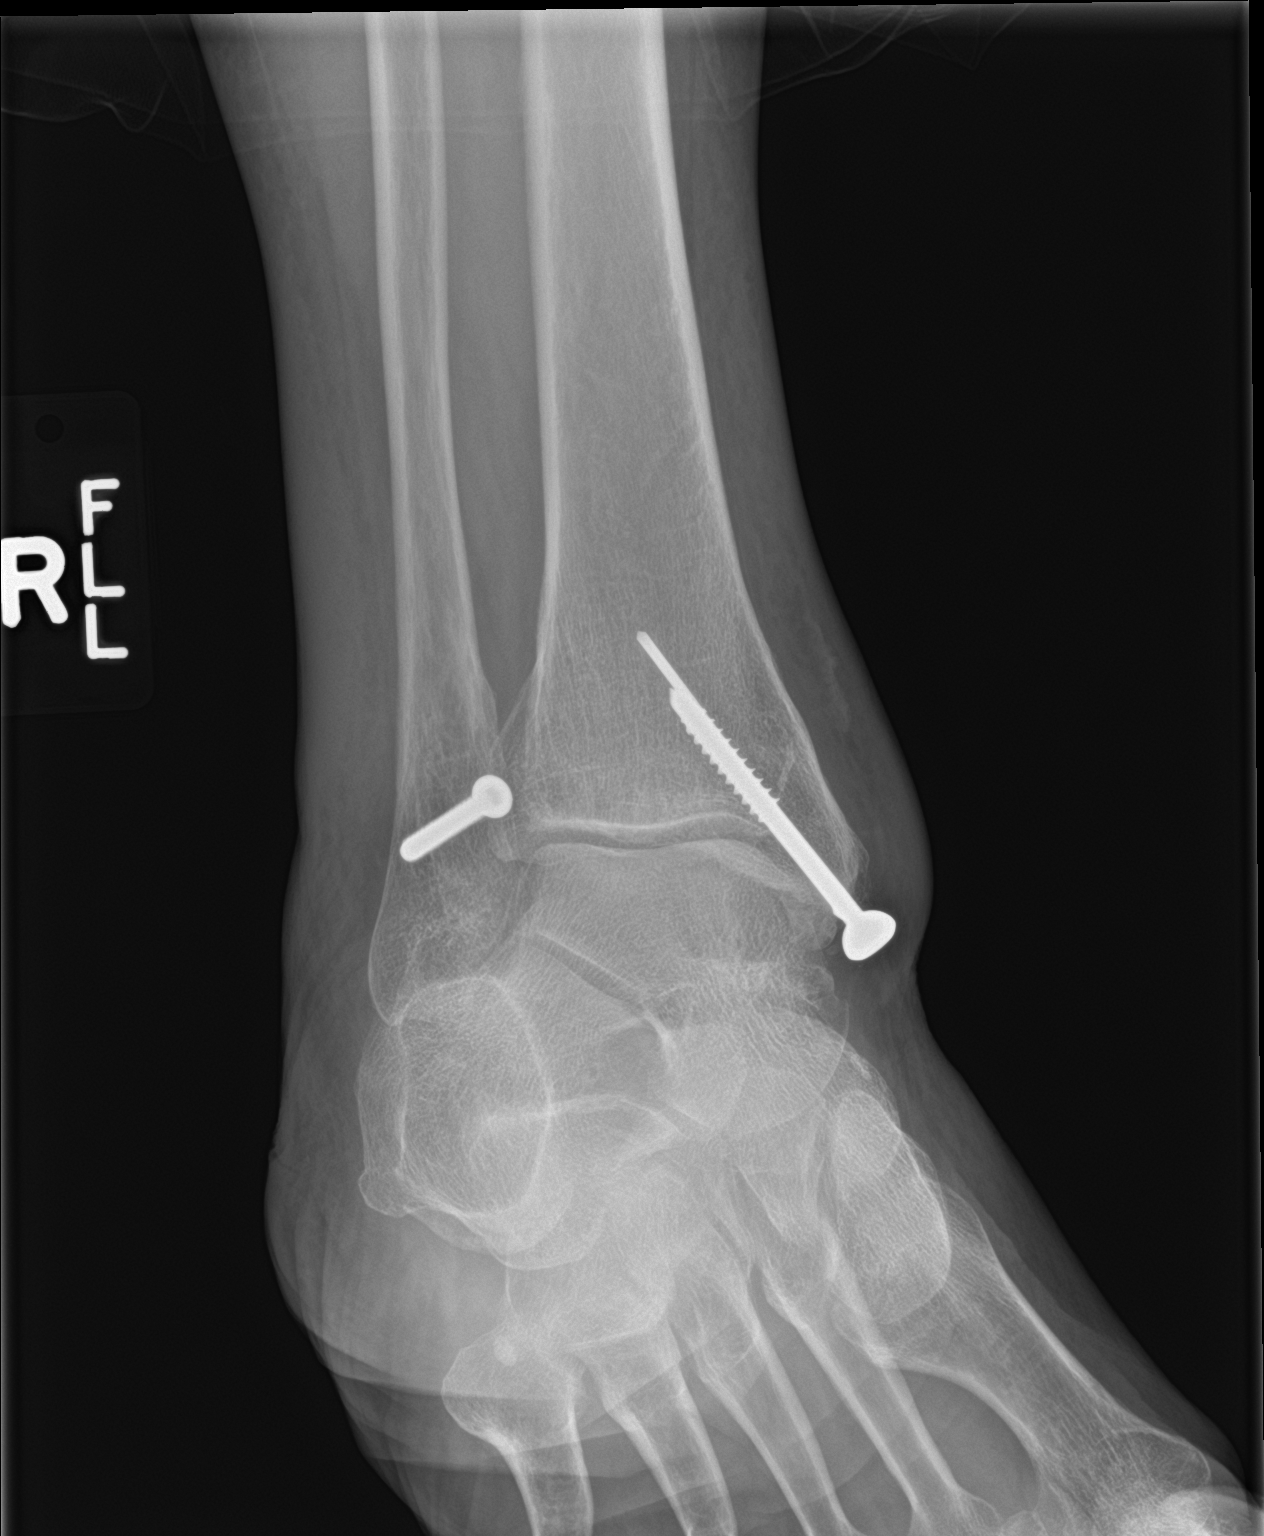

[3 of 3 positions shown; findings below may reference images not displayed]

FINDINGS: Screw and wire fixation of the medial malleolus and screw fixation
of the lateral malleolus. No acute fracture or dislocation is seen.
There is mild-to-moderate soft tissue swelling, most pronounced
medially. No effusion is seen. Flattening of the normal plantar arch
is noted.
IMPRESSION: No acute fracture.  Pes planus.

## 2020-09-10 DIAGNOSIS — E785 Hyperlipidemia, unspecified: Secondary | ICD-10-CM | POA: Diagnosis not present

## 2020-09-10 DIAGNOSIS — I10 Essential (primary) hypertension: Secondary | ICD-10-CM | POA: Diagnosis not present

## 2020-12-04 DIAGNOSIS — F209 Schizophrenia, unspecified: Secondary | ICD-10-CM | POA: Diagnosis not present

## 2020-12-09 DIAGNOSIS — Z1389 Encounter for screening for other disorder: Secondary | ICD-10-CM | POA: Diagnosis not present

## 2020-12-09 DIAGNOSIS — Z1331 Encounter for screening for depression: Secondary | ICD-10-CM | POA: Diagnosis not present

## 2020-12-09 DIAGNOSIS — F209 Schizophrenia, unspecified: Secondary | ICD-10-CM | POA: Diagnosis not present

## 2020-12-09 DIAGNOSIS — E785 Hyperlipidemia, unspecified: Secondary | ICD-10-CM | POA: Diagnosis not present

## 2020-12-09 DIAGNOSIS — I10 Essential (primary) hypertension: Secondary | ICD-10-CM | POA: Diagnosis not present

## 2020-12-17 ENCOUNTER — Encounter: Payer: Self-pay | Admitting: Internal Medicine

## 2021-01-18 ENCOUNTER — Ambulatory Visit: Payer: Medicare Other

## 2021-05-19 DIAGNOSIS — I10 Essential (primary) hypertension: Secondary | ICD-10-CM | POA: Diagnosis not present

## 2021-05-19 DIAGNOSIS — Z79899 Other long term (current) drug therapy: Secondary | ICD-10-CM | POA: Diagnosis not present

## 2021-05-19 DIAGNOSIS — E785 Hyperlipidemia, unspecified: Secondary | ICD-10-CM | POA: Diagnosis not present

## 2021-05-24 DIAGNOSIS — Z79899 Other long term (current) drug therapy: Secondary | ICD-10-CM | POA: Diagnosis not present

## 2021-05-26 DIAGNOSIS — F209 Schizophrenia, unspecified: Secondary | ICD-10-CM | POA: Diagnosis not present

## 2021-05-26 DIAGNOSIS — Z79899 Other long term (current) drug therapy: Secondary | ICD-10-CM | POA: Diagnosis not present

## 2021-06-23 DIAGNOSIS — Z79899 Other long term (current) drug therapy: Secondary | ICD-10-CM | POA: Diagnosis not present

## 2021-06-23 DIAGNOSIS — E785 Hyperlipidemia, unspecified: Secondary | ICD-10-CM | POA: Diagnosis not present

## 2021-06-23 DIAGNOSIS — I10 Essential (primary) hypertension: Secondary | ICD-10-CM | POA: Diagnosis not present

## 2021-07-07 DIAGNOSIS — F209 Schizophrenia, unspecified: Secondary | ICD-10-CM | POA: Diagnosis not present

## 2021-07-07 DIAGNOSIS — Z79899 Other long term (current) drug therapy: Secondary | ICD-10-CM | POA: Diagnosis not present

## 2021-07-21 DIAGNOSIS — I10 Essential (primary) hypertension: Secondary | ICD-10-CM | POA: Diagnosis not present

## 2021-07-21 DIAGNOSIS — Z79899 Other long term (current) drug therapy: Secondary | ICD-10-CM | POA: Diagnosis not present

## 2021-07-21 DIAGNOSIS — E785 Hyperlipidemia, unspecified: Secondary | ICD-10-CM | POA: Diagnosis not present

## 2021-08-02 DIAGNOSIS — Z23 Encounter for immunization: Secondary | ICD-10-CM | POA: Diagnosis not present

## 2021-08-04 DIAGNOSIS — Z79899 Other long term (current) drug therapy: Secondary | ICD-10-CM | POA: Diagnosis not present

## 2021-08-04 DIAGNOSIS — F209 Schizophrenia, unspecified: Secondary | ICD-10-CM | POA: Diagnosis not present

## 2021-08-05 DIAGNOSIS — Z79899 Other long term (current) drug therapy: Secondary | ICD-10-CM | POA: Diagnosis not present

## 2021-08-18 DIAGNOSIS — E785 Hyperlipidemia, unspecified: Secondary | ICD-10-CM | POA: Diagnosis not present

## 2021-08-18 DIAGNOSIS — F209 Schizophrenia, unspecified: Secondary | ICD-10-CM | POA: Diagnosis not present

## 2021-08-18 DIAGNOSIS — I10 Essential (primary) hypertension: Secondary | ICD-10-CM | POA: Diagnosis not present

## 2021-08-18 DIAGNOSIS — Z79899 Other long term (current) drug therapy: Secondary | ICD-10-CM | POA: Diagnosis not present

## 2021-09-06 DIAGNOSIS — F209 Schizophrenia, unspecified: Secondary | ICD-10-CM | POA: Diagnosis not present

## 2021-09-06 DIAGNOSIS — Z79899 Other long term (current) drug therapy: Secondary | ICD-10-CM | POA: Diagnosis not present

## 2021-09-07 DIAGNOSIS — Z23 Encounter for immunization: Secondary | ICD-10-CM | POA: Diagnosis not present

## 2021-09-07 DIAGNOSIS — Z79899 Other long term (current) drug therapy: Secondary | ICD-10-CM | POA: Diagnosis not present

## 2021-09-15 DIAGNOSIS — E785 Hyperlipidemia, unspecified: Secondary | ICD-10-CM | POA: Diagnosis not present

## 2021-09-15 DIAGNOSIS — Z79899 Other long term (current) drug therapy: Secondary | ICD-10-CM | POA: Diagnosis not present

## 2021-09-15 DIAGNOSIS — F209 Schizophrenia, unspecified: Secondary | ICD-10-CM | POA: Diagnosis not present

## 2021-09-15 DIAGNOSIS — I10 Essential (primary) hypertension: Secondary | ICD-10-CM | POA: Diagnosis not present

## 2021-10-02 DIAGNOSIS — Z79899 Other long term (current) drug therapy: Secondary | ICD-10-CM | POA: Diagnosis not present

## 2021-10-05 DIAGNOSIS — H2513 Age-related nuclear cataract, bilateral: Secondary | ICD-10-CM | POA: Diagnosis not present

## 2021-10-06 DIAGNOSIS — F209 Schizophrenia, unspecified: Secondary | ICD-10-CM | POA: Diagnosis not present

## 2021-10-06 DIAGNOSIS — Z79899 Other long term (current) drug therapy: Secondary | ICD-10-CM | POA: Diagnosis not present

## 2021-10-11 DIAGNOSIS — I10 Essential (primary) hypertension: Secondary | ICD-10-CM | POA: Diagnosis not present

## 2021-10-20 DIAGNOSIS — E785 Hyperlipidemia, unspecified: Secondary | ICD-10-CM | POA: Diagnosis not present

## 2021-10-20 DIAGNOSIS — Z79899 Other long term (current) drug therapy: Secondary | ICD-10-CM | POA: Diagnosis not present

## 2021-10-20 DIAGNOSIS — I10 Essential (primary) hypertension: Secondary | ICD-10-CM | POA: Diagnosis not present

## 2021-10-20 DIAGNOSIS — F209 Schizophrenia, unspecified: Secondary | ICD-10-CM | POA: Diagnosis not present

## 2021-11-03 DIAGNOSIS — Z79899 Other long term (current) drug therapy: Secondary | ICD-10-CM | POA: Diagnosis not present

## 2021-11-03 DIAGNOSIS — I10 Essential (primary) hypertension: Secondary | ICD-10-CM | POA: Diagnosis not present

## 2021-11-03 DIAGNOSIS — E785 Hyperlipidemia, unspecified: Secondary | ICD-10-CM | POA: Diagnosis not present

## 2021-11-03 DIAGNOSIS — F209 Schizophrenia, unspecified: Secondary | ICD-10-CM | POA: Diagnosis not present

## 2021-11-17 DIAGNOSIS — E785 Hyperlipidemia, unspecified: Secondary | ICD-10-CM | POA: Diagnosis not present

## 2021-11-17 DIAGNOSIS — I10 Essential (primary) hypertension: Secondary | ICD-10-CM | POA: Diagnosis not present

## 2021-11-17 DIAGNOSIS — J309 Allergic rhinitis, unspecified: Secondary | ICD-10-CM | POA: Diagnosis not present

## 2021-11-17 DIAGNOSIS — F209 Schizophrenia, unspecified: Secondary | ICD-10-CM | POA: Diagnosis not present

## 2021-11-17 DIAGNOSIS — Z79899 Other long term (current) drug therapy: Secondary | ICD-10-CM | POA: Diagnosis not present

## 2021-12-01 DIAGNOSIS — Z79899 Other long term (current) drug therapy: Secondary | ICD-10-CM | POA: Diagnosis not present

## 2021-12-01 DIAGNOSIS — F209 Schizophrenia, unspecified: Secondary | ICD-10-CM | POA: Diagnosis not present

## 2021-12-15 DIAGNOSIS — Z79899 Other long term (current) drug therapy: Secondary | ICD-10-CM | POA: Diagnosis not present

## 2021-12-15 DIAGNOSIS — J309 Allergic rhinitis, unspecified: Secondary | ICD-10-CM | POA: Diagnosis not present

## 2021-12-15 DIAGNOSIS — E785 Hyperlipidemia, unspecified: Secondary | ICD-10-CM | POA: Diagnosis not present

## 2021-12-15 DIAGNOSIS — I10 Essential (primary) hypertension: Secondary | ICD-10-CM | POA: Diagnosis not present

## 2021-12-28 ENCOUNTER — Encounter (HOSPITAL_COMMUNITY): Payer: Self-pay | Admitting: *Deleted

## 2021-12-28 ENCOUNTER — Emergency Department (HOSPITAL_COMMUNITY)
Admission: EM | Admit: 2021-12-28 | Discharge: 2021-12-28 | Payer: Medicare Other | Attending: Emergency Medicine | Admitting: Emergency Medicine

## 2021-12-28 DIAGNOSIS — I10 Essential (primary) hypertension: Secondary | ICD-10-CM | POA: Diagnosis not present

## 2021-12-28 DIAGNOSIS — R1032 Left lower quadrant pain: Secondary | ICD-10-CM | POA: Diagnosis not present

## 2021-12-28 DIAGNOSIS — Z79899 Other long term (current) drug therapy: Secondary | ICD-10-CM | POA: Diagnosis not present

## 2021-12-28 DIAGNOSIS — R2243 Localized swelling, mass and lump, lower limb, bilateral: Secondary | ICD-10-CM | POA: Insufficient documentation

## 2021-12-28 DIAGNOSIS — E119 Type 2 diabetes mellitus without complications: Secondary | ICD-10-CM | POA: Insufficient documentation

## 2021-12-28 DIAGNOSIS — M545 Low back pain, unspecified: Secondary | ICD-10-CM | POA: Insufficient documentation

## 2021-12-28 DIAGNOSIS — I959 Hypotension, unspecified: Secondary | ICD-10-CM | POA: Diagnosis not present

## 2021-12-28 DIAGNOSIS — G8929 Other chronic pain: Secondary | ICD-10-CM | POA: Diagnosis not present

## 2021-12-28 DIAGNOSIS — R609 Edema, unspecified: Secondary | ICD-10-CM | POA: Diagnosis not present

## 2021-12-28 DIAGNOSIS — R220 Localized swelling, mass and lump, head: Secondary | ICD-10-CM | POA: Diagnosis present

## 2021-12-28 DIAGNOSIS — R52 Pain, unspecified: Secondary | ICD-10-CM | POA: Diagnosis not present

## 2021-12-28 DIAGNOSIS — M270 Developmental disorders of jaws: Secondary | ICD-10-CM | POA: Diagnosis not present

## 2021-12-28 DIAGNOSIS — M25512 Pain in left shoulder: Secondary | ICD-10-CM | POA: Diagnosis not present

## 2021-12-28 LAB — COMPREHENSIVE METABOLIC PANEL
ALT: 59 U/L — ABNORMAL HIGH (ref 0–44)
AST: 42 U/L — ABNORMAL HIGH (ref 15–41)
Albumin: 4.3 g/dL (ref 3.5–5.0)
Alkaline Phosphatase: 75 U/L (ref 38–126)
Anion gap: 8 (ref 5–15)
BUN: 33 mg/dL — ABNORMAL HIGH (ref 8–23)
CO2: 21 mmol/L — ABNORMAL LOW (ref 22–32)
Calcium: 10 mg/dL (ref 8.9–10.3)
Chloride: 111 mmol/L (ref 98–111)
Creatinine, Ser: 1.83 mg/dL — ABNORMAL HIGH (ref 0.44–1.00)
GFR, Estimated: 29 mL/min — ABNORMAL LOW (ref >60)
Glucose, Bld: 100 mg/dL — ABNORMAL HIGH (ref 70–99)
Potassium: 5.4 mmol/L — ABNORMAL HIGH (ref 3.5–5.1)
Sodium: 140 mmol/L (ref 135–145)
Total Bilirubin: 0.7 mg/dL (ref 0.3–1.2)
Total Protein: 8.5 g/dL — ABNORMAL HIGH (ref 6.5–8.1)

## 2021-12-28 LAB — CBC
HCT: 35.2 % — ABNORMAL LOW (ref 36.0–46.0)
Hemoglobin: 11.4 g/dL — ABNORMAL LOW (ref 12.0–15.0)
MCH: 32.7 pg (ref 26.0–34.0)
MCHC: 32.4 g/dL (ref 30.0–36.0)
MCV: 100.9 fL — ABNORMAL HIGH (ref 80.0–100.0)
Platelets: 295 10*3/uL (ref 150–400)
RBC: 3.49 MIL/uL — ABNORMAL LOW (ref 3.87–5.11)
RDW: 12.6 % (ref 11.5–15.5)
WBC: 9.6 10*3/uL (ref 4.0–10.5)
nRBC: 0 % (ref 0.0–0.2)

## 2021-12-28 MED ORDER — DIAZEPAM 2 MG PO TABS
2.0000 mg | ORAL_TABLET | Freq: Once | ORAL | Status: AC
  Administered 2021-12-28: 2 mg via ORAL
  Filled 2021-12-28: qty 1

## 2021-12-28 MED ORDER — ACETAMINOPHEN 325 MG PO TABS
650.0000 mg | ORAL_TABLET | Freq: Once | ORAL | Status: AC
  Administered 2021-12-28: 650 mg via ORAL
  Filled 2021-12-28: qty 2

## 2021-12-28 MED ORDER — DIAZEPAM 5 MG PO TABS: 5.0000 mg | ORAL_TABLET | Freq: Once | ORAL | Status: DC

## 2021-12-28 MED ORDER — SODIUM ZIRCONIUM CYCLOSILICATE 5 G PO PACK
10.0000 g | PACK | Freq: Once | ORAL | Status: AC
  Administered 2021-12-28: 10 g via ORAL
  Filled 2021-12-28: qty 2

## 2021-12-28 NOTE — ED Triage Notes (Signed)
Patient has multiple complaints, mouth pain, feet swelling, bilateral shoulder pain , left hip pain, weakness ?

## 2021-12-28 NOTE — ED Provider Notes (Signed)
[facility] Provider Note   CSN: [id number] Arrival date & time: 12/28/21  1228     History  No chief complaint on file.   Ellen Mcmillan is a 76 y.o. female.  HPI Patient presenting for multiple complaints.  Per chart review, her medical history includes DM2, HTN, schizophrenia.  She currently resides in a group home.  She presented to the ED via EMS for the following complaints:  -She has had chronic leg swelling.  This is bilateral and does worsen throughout the day and improves with leg elevation.   -She has a swelling on the roof of her mouth.  She has recently noticed this. -She has chronic pain in areas of left shoulder, left groin, and lower back.  She takes ibuprofen for this. -She feels cold in the winter and cold in the summer when there is air conditioning on Patient currently lives in a group home.     Home Medications Prior to Admission medications   Medication Sig Start Date End Date Taking? Authorizing Provider  acetaminophen (TYLENOL) 325 MG tablet Take 650 mg by mouth every 6 (six) hours as needed for mild pain.   Yes [provider]  amLODipine (NORVASC) 10 MG tablet Take 10 mg by mouth daily.   Yes [provider]  atenolol (TENORMIN) 50 MG tablet Take 50 mg by mouth daily.   Yes [provider]  docusate sodium (COLACE) 100 MG capsule Take 100 mg by mouth at bedtime as needed for mild constipation.   Yes [provider]  losartan-hydrochlorothiazide (HYZAAR) 100-25 MG tablet Take 1 tablet by mouth daily.   Yes [provider]  Multiple Vitamin (MULTIVITAMIN) tablet Take 1 tablet by mouth daily.   Yes [provider]  simvastatin (ZOCOR) 20 MG tablet Take 20 mg by mouth daily.   Yes [provider]  spironolactone (ALDACTONE) 25 MG tablet Take 25 mg by mouth 2 (two) times daily. 10/18/18  Yes [provider]  ziprasidone (GEODON) 60 MG capsule Take 60-80 mg by mouth 2  (two) times daily with a meal. 60mg  in am, 80mg  at bedtime   Yes [provider]  hydrOXYzine (ATARAX/VISTARIL) 25 MG tablet Take 1 tablet (25 mg total) by mouth every 6 (six) hours. Patient not taking: Reported on 12/28/2021 10/13/16   Daleen Bo, MD  sulfamethoxazole-trimethoprim (BACTRIM) 400-80 MG tablet Take 1 tablet by mouth 2 (two) times daily. Patient not taking: Reported on 12/28/2021 03/26/20   Carole Civil, MD      Allergies    Patient has no known allergies.    Review of Systems   Review of Systems  HENT:  Positive for mouth sores.   Cardiovascular:  Positive for leg swelling.  Musculoskeletal:  Positive for arthralgias and back pain.  All other systems reviewed and are negative.  Physical Exam Updated Vital Signs BP (!) 136/41    Pulse 66    Temp 98.3 F (36.8 C) (Oral)    Resp 15    SpO2 99%  Physical Exam Vitals and nursing note reviewed.  Constitutional:      General: She is not in acute distress.    Appearance: Normal appearance. She is well-developed. She is not ill-appearing, toxic-appearing or diaphoretic.  HENT:     Head: Normocephalic and atraumatic.     Right Ear: External ear normal.     Left Ear: External ear normal.     Nose: Nose normal.     Mouth/Throat:  Mouth: Mucous membranes are moist.     Pharynx: Oropharynx is clear.     Comments: Torus palatinus present Eyes:     General: No scleral icterus.    Extraocular Movements: Extraocular movements intact.     Conjunctiva/sclera: Conjunctivae normal.  Cardiovascular:     Rate and Rhythm: Normal rate and regular rhythm.     Heart sounds: No murmur heard. Pulmonary:     Effort: Pulmonary effort is normal. No respiratory distress.     Breath sounds: Normal breath sounds. No wheezing or rales.  Chest:     Chest wall: No tenderness.  Abdominal:     Palpations: Abdomen is soft.     Tenderness: There is no abdominal tenderness.  Musculoskeletal:        General: No swelling or  deformity. Normal range of motion.     Cervical back: Normal range of motion and neck supple.     Right lower leg: No edema.     Left lower leg: No edema.  Skin:    General: Skin is warm and dry.     Capillary Refill: Capillary refill takes less than 2 seconds.     Coloration: Skin is not jaundiced or pale.  Neurological:     General: No focal deficit present.     Mental Status: She is alert. Mental status is at baseline.     Cranial Nerves: No cranial nerve deficit.     Sensory: No sensory deficit.     Motor: No weakness.     Coordination: Coordination normal.  Psychiatric:        Mood and Affect: Mood normal.        Behavior: Behavior normal.    ED Results / Procedures / Treatments   Labs (all labs ordered are listed, but only abnormal results are displayed) Labs Reviewed  COMPREHENSIVE METABOLIC PANEL - Abnormal; Notable for the following components:      Result Value   Potassium 5.4 (*)    CO2 21 (*)    Glucose, Bld 100 (*)    BUN 33 (*)    Creatinine, Ser 1.83 (*)    Total Protein 8.5 (*)    AST 42 (*)    ALT 59 (*)    GFR, Estimated 29 (*)    All other components within normal limits  CBC - Abnormal; Notable for the following components:   RBC 3.49 (*)    Hemoglobin 11.4 (*)    HCT 35.2 (*)    MCV 100.9 (*)    All other components within normal limits    EKG EKG Interpretation  Date/Time:  Tuesday December 28 2021 17:26:26 EST Ventricular Rate:  73 PR Interval:  178 QRS Duration: 83 QT Interval:  393 QTC Calculation: 433 R Axis:     Text Interpretation: Sinus rhythm Nonspecific T wave abnormality No previous tracing Confirmed by Blanchie Dessert [phone]) on 12/29/2021 1:15:53 PM  Radiology No results found.  Procedures Procedures    Medications Ordered in ED Medications  acetaminophen (TYLENOL) tablet 650 mg (650 mg Oral Given 12/28/21 1649)  diazepam (VALIUM) tablet 2 mg (2 mg Oral Given 12/28/21 1649)  sodium zirconium cyclosilicate (LOKELMA) packet  10 g (10 g Oral Given 12/28/21 1938)    ED Course/ Medical Decision Making/ A&P                           Medical Decision Making Amount and/or Complexity of Data Reviewed Labs: ordered.  Risk OTC drugs. Prescription drug management.   Patient is a 76 year old female with history of DM2, HTN, schizophrenia, presenting for multiple complaints.  On arrival in the ED, she is well-appearing.  Her vital signs are notable for hypertension only.  Patient's primary concern appears to be a lesion that is on the roof of her mouth.  On direct inspection, this does appear to be a torus palatinus.  I do not suspect that this is an acute problem, however, does appear to have been recently been bothering the patient.  There is no evidence of overlying abrasion or erythema.  Patient was informed that this is a benign lesion that does not require any acute management.  She was also informed that this is something that could be surgically removed if it does cause her sustained discomfort or distress.  Patient also has concerns of bilateral leg swelling that she reports is worse during the day when she is upright and moving around and improves with leg elevation.  Currently, I do not appreciate any significant lower extremity edema.  Similar to torus palatinus, patient's history of leg swelling is likely chronic condition is likely multifactorial in nature.  Patient was advised to continue to elevate legs when she can and to utilize compression stockings as needed.  Patient also has complaints of different areas of pain, left hip, lower back, left shoulder.  She does state that these are chronic issues.  Due to left shoulder pain, EKG was obtained and there is no evidence of ischemia.  Given her age and comorbidities, lab work was obtained.  Lab work was notable for some mild hyperkalemia.  Dose of Lokelma was given and patient was advised to consume a low potassium diet.  She is prescribed losartan which can elevate  potassium.  She was advised to follow-up with her primary care doctor to discuss home medications and to have repeat lab work checked.  While in the ED, she did not endorse any new or worsening symptoms.  Her brother arrived and was able to transport her home.  She was discharged in stable condition.  Social determinants of health: History of mental illness, resides in group home        Final Clinical Impression(s) / ED Diagnoses Final diagnoses:  Torus palatinus  Other chronic pain    Rx / DC Orders ED Discharge Orders     None         Godfrey Pick, MD 12/29/21 1329

## 2021-12-28 NOTE — Discharge Instructions (Signed)
Maintain a low potassium diet.  Generally healthy foods are high in potassium.  This includes fruits and vegetables.  Follow-up with your primary care doctor for repeat lab testing to ensure that your potassium normalizes.  Talk to her about your daily medications as well, as some of these can affect potassium levels.  Please return to the emergency department if you have any worsening of symptoms. ?

## 2021-12-29 DIAGNOSIS — Z79899 Other long term (current) drug therapy: Secondary | ICD-10-CM | POA: Diagnosis not present

## 2021-12-29 DIAGNOSIS — F209 Schizophrenia, unspecified: Secondary | ICD-10-CM | POA: Diagnosis not present

## 2022-01-12 DIAGNOSIS — I10 Essential (primary) hypertension: Secondary | ICD-10-CM | POA: Diagnosis not present

## 2022-01-12 DIAGNOSIS — E785 Hyperlipidemia, unspecified: Secondary | ICD-10-CM | POA: Diagnosis not present

## 2022-01-12 DIAGNOSIS — J309 Allergic rhinitis, unspecified: Secondary | ICD-10-CM | POA: Diagnosis not present

## 2022-01-12 DIAGNOSIS — Z79899 Other long term (current) drug therapy: Secondary | ICD-10-CM | POA: Diagnosis not present

## 2022-02-02 DIAGNOSIS — Z79899 Other long term (current) drug therapy: Secondary | ICD-10-CM | POA: Diagnosis not present

## 2022-02-02 DIAGNOSIS — J309 Allergic rhinitis, unspecified: Secondary | ICD-10-CM | POA: Diagnosis not present

## 2022-02-02 DIAGNOSIS — E785 Hyperlipidemia, unspecified: Secondary | ICD-10-CM | POA: Diagnosis not present

## 2022-02-02 DIAGNOSIS — I10 Essential (primary) hypertension: Secondary | ICD-10-CM | POA: Diagnosis not present

## 2022-02-03 ENCOUNTER — Telehealth: Payer: Self-pay

## 2022-02-03 NOTE — Telephone Encounter (Signed)
NOTES SCANNED TO REFERRAL 

## 2022-02-24 DIAGNOSIS — I10 Essential (primary) hypertension: Secondary | ICD-10-CM | POA: Diagnosis not present

## 2022-02-24 DIAGNOSIS — E785 Hyperlipidemia, unspecified: Secondary | ICD-10-CM | POA: Diagnosis not present

## 2022-02-24 DIAGNOSIS — F209 Schizophrenia, unspecified: Secondary | ICD-10-CM | POA: Diagnosis not present

## 2022-02-24 DIAGNOSIS — F309 Manic episode, unspecified: Secondary | ICD-10-CM | POA: Diagnosis not present

## 2022-04-13 ENCOUNTER — Encounter: Payer: Self-pay | Admitting: Internal Medicine

## 2022-04-13 ENCOUNTER — Ambulatory Visit (INDEPENDENT_AMBULATORY_CARE_PROVIDER_SITE_OTHER): Payer: Medicare Other | Admitting: Internal Medicine

## 2022-04-13 VITALS — BP 156/70 | HR 81 | Ht 64.0 in | Wt 181.0 lb

## 2022-04-13 DIAGNOSIS — I10 Essential (primary) hypertension: Secondary | ICD-10-CM

## 2022-04-13 DIAGNOSIS — R609 Edema, unspecified: Secondary | ICD-10-CM | POA: Diagnosis not present

## 2022-04-13 DIAGNOSIS — E119 Type 2 diabetes mellitus without complications: Secondary | ICD-10-CM

## 2022-04-13 MED ORDER — CARVEDILOL 12.5 MG PO TABS: 12.5000 mg | ORAL_TABLET | Freq: Two times a day (BID) | ORAL | 3 refills | Status: DC

## 2022-04-13 MED ORDER — AMLODIPINE BESYLATE 5 MG PO TABS: 5.0000 mg | ORAL_TABLET | Freq: Every day | ORAL | 3 refills | Status: DC

## 2022-04-13 NOTE — Progress Notes (Signed)
OFFICE CONSULT NOTE  Chief Complaint:  Edema, possible CHF  Primary Care Physician: Duffy Rhody, FNP  HPI:  Ellen Mcmillan is a 76 y.o. female who is being seen today for the evaluation of edema, possible CHF at the request of Duffy Rhody, FNP. This is a pleasant 76 year old female kindly referred for evaluation and management of edema, possible congestive heart failure.  She has a longstanding history of diabetes, hypertension since her 98s as well as schizophrenia in a long-term care facility.  She has had poorly controlled hypertension on multiple medications including high-dose amlodipine.  Swelling has been persistent she says for the past several months.  She is also on spironolactone and a combination losartan HCTZ.  She reports the swelling is worse as the day goes on but generally improves by the morning after she elevates her feet.  She denies any shortness of breath, orthopnea, chest pain PND or other associate heart failure symptoms.  PMHx:  Past Medical History:  Diagnosis Date   Diabetes mellitus without complication (HCC)    Hypertension    Ovarian cyst    Schizophrenia (HCC)     Past Surgical History:  Procedure Laterality Date   ABDOMINAL HYSTERECTOMY     FRACTURE SURGERY      FAMHx:  Family History  Problem Relation Age of Onset   Mental illness Sister    Mental illness Other    Cancer Other    Stroke Other    Cancer Mother    Stroke Mother    Cancer Father     SOCHx:   reports that she has never smoked. She has never used smokeless tobacco. She reports that she does not drink alcohol and does not use drugs.  ALLERGIES:  No Known Allergies  ROS: Pertinent items noted in HPI and remainder of comprehensive ROS otherwise negative.  HOME MEDS: Current Outpatient Medications on File Prior to Visit  Medication Sig Dispense Refill   acetaminophen (TYLENOL) 325 MG tablet Take 650 mg by mouth every 6 (six) hours as needed for mild pain.      amLODipine (NORVASC) 10 MG tablet Take 10 mg by mouth daily.     atenolol (TENORMIN) 50 MG tablet Take 50 mg by mouth daily.     docusate sodium (COLACE) 100 MG capsule Take 100 mg by mouth at bedtime as needed for mild constipation.     losartan-hydrochlorothiazide (HYZAAR) 100-25 MG tablet Take 1 tablet by mouth daily.     Multiple Vitamin (MULTIVITAMIN) tablet Take 1 tablet by mouth daily.     simvastatin (ZOCOR) 20 MG tablet Take 20 mg by mouth daily.     spironolactone (ALDACTONE) 25 MG tablet Take 25 mg by mouth 2 (two) times daily.     ziprasidone (GEODON) 60 MG capsule Take 60-80 mg by mouth 2 (two) times daily with a meal. 60mg  in am, 80mg  at bedtime     hydrOXYzine (ATARAX/VISTARIL) 25 MG tablet Take 1 tablet (25 mg total) by mouth every 6 (six) hours. (Patient not taking: Reported on 12/28/2021) 12 tablet 0   sulfamethoxazole-trimethoprim (BACTRIM) 400-80 MG tablet Take 1 tablet by mouth 2 (two) times daily. (Patient not taking: Reported on 12/28/2021) 56 tablet 0   No current facility-administered medications on file prior to visit.    LABS/IMAGING: No results found for this or any previous visit (from the past 48 hour(s)). No results found.  LIPID PANEL: No results found for: "CHOL", "TRIG", "HDL", "CHOLHDL", "VLDL", "LDLCALC", "LDLDIRECT"  WEIGHTS: Wt  Readings from Last 3 Encounters:  04/13/22 181 lb (82.1 kg)  12/24/18 148 lb (67.1 kg)  11/14/18 148 lb (67.1 kg)    VITALS: BP (!) 156/70   Pulse 81   Ht 5\' 4"  (1.626 m)   Wt 181 lb (82.1 kg)   SpO2 95%   BMI 31.07 kg/m   EXAM: General appearance: alert, no distress, and mildly obese Neck: no carotid bruit, no JVD, and thyroid not enlarged, symmetric, no tenderness/mass/nodules Lungs: clear to auscultation bilaterally Heart: regular rate and rhythm, S1, S2 normal, no murmur, click, rub or gallop Abdomen: soft, non-tender; bowel sounds normal; no masses,  no organomegaly Extremities: edema 2+ bilateral pitting  lower extremity Pulses: 2+ and symmetric Skin: Skin color, texture, turgor normal. No rashes or lesions Neurologic: Grossly normal Psych: Pleasant  EKG: Deferred  ASSESSMENT: Lower extremity edema Uncontrolled hypertension Type 2 diabetes  PLAN: 1.   [name] has acute on chronic lower extremity edema.  I suspect this may be related to combination of factors including diabetes, longstanding hypertension, and high-dose amlodipine.  She has no signs or symptoms of heart failure but does have longstanding uncontrolled hypertension and could have some degree of hypertensive cardiomyopathy.  I would like to go ahead and get an echocardiogram to evaluate this.  I would advise decreasing her amlodipine from 10 to 5 mg daily.  As her blood pressure is not controlled and would be worse, will need additional therapy.  Stop atenolol and switch to a more lucid tropic beta-blocker, carvedilol 12.5 mg twice daily.  Would continue her losartan/HCTZ and spironolactone.  Will plan follow-up with her in about 3 to 4 months.  I have also prescribed 20-30 mmHg knee-high bilateral compression stockings which I think will be helpful for her to wear during the day and remove at night making sure she elevates her feet.  Thanks again for the kind referral.  Laural Benes, MD, [facility]  [place]  [facility]  Medical Director of the [facility] Lipid [facility] &  Cardiovascular Risk Reduction Clinic Diplomate of the American Board of Clinical Lipidology Attending Cardiologist  Direct Dial: [phone]  Fax: [phone]  Website:  [email or web address] 04/13/2022, 8:58 AM

## 2022-04-13 NOTE — Patient Instructions (Signed)
Medication Instructions:  Your physician has recommended you make the following change in your medication:   Stop Atenolol Decrease Amlodipine to 5 mg tablets daily Start Coreg 12.5 mg tablets twice daily   Labwork: None  Testing/Procedures: Your physician has requested that you have an echocardiogram. Echocardiography is a painless test that uses sound waves to create images of your heart. It provides your doctor with information about the size and shape of your heart and how well your heart's chambers and valves are working. This procedure takes approximately one hour. There are no restrictions for this procedure.   Follow-Up: Follow up with Dr. Rennis Golden in 3-4 months.   Any Other Special Instructions Will Be Listed Below (If Applicable).     If you need a refill on your cardiac medications before your next appointment, please call your pharmacy.

## 2022-04-21 ENCOUNTER — Inpatient Hospital Stay (HOSPITAL_COMMUNITY): Admission: RE | Admit: 2022-04-21 | Payer: Medicare Other | Source: Ambulatory Visit

## 2022-05-04 ENCOUNTER — Ambulatory Visit (HOSPITAL_COMMUNITY)
Admission: RE | Admit: 2022-05-04 | Discharge: 2022-05-04 | Disposition: A | Discharge status: Home / Self Care | Payer: Medicare Other | Source: Ambulatory Visit | Attending: Internal Medicine | Admitting: Internal Medicine

## 2022-05-04 DIAGNOSIS — I10 Essential (primary) hypertension: Secondary | ICD-10-CM | POA: Diagnosis not present

## 2022-05-04 DIAGNOSIS — R600 Localized edema: Secondary | ICD-10-CM | POA: Diagnosis not present

## 2022-05-04 DIAGNOSIS — R609 Edema, unspecified: Secondary | ICD-10-CM | POA: Insufficient documentation

## 2022-05-04 LAB — ECHOCARDIOGRAM COMPLETE
AR max vel: 1.53 cm2
AV Area VTI: 1.51 cm2
AV Area mean vel: 1.57 cm2
AV Mean grad: 6 mmHg
AV Peak grad: 10.5 mmHg
Ao pk vel: 1.62 m/s
Area-P 1/2: 3.08 cm2
S' Lateral: 2.3 cm

## 2022-05-04 NOTE — Progress Notes (Signed)
*  PRELIMINARY RESULTS* Echocardiogram 2D Echocardiogram has been performed.  Stacey Drain 05/04/2022, 10:22 AM

## 2022-06-14 ENCOUNTER — Encounter: Payer: Self-pay | Admitting: Internal Medicine

## 2022-06-29 ENCOUNTER — Other Ambulatory Visit: Payer: Self-pay | Admitting: Internal Medicine

## 2022-08-02 ENCOUNTER — Encounter: Payer: Self-pay | Admitting: Student

## 2022-08-02 ENCOUNTER — Ambulatory Visit: Payer: Medicare HMO | Attending: Student | Admitting: Student

## 2022-08-02 VITALS — BP 142/78 | HR 84 | Wt 174.0 lb

## 2022-08-02 DIAGNOSIS — F209 Schizophrenia, unspecified: Secondary | ICD-10-CM | POA: Diagnosis not present

## 2022-08-02 DIAGNOSIS — I10 Essential (primary) hypertension: Secondary | ICD-10-CM

## 2022-08-02 DIAGNOSIS — R600 Localized edema: Secondary | ICD-10-CM | POA: Diagnosis not present

## 2022-08-02 DIAGNOSIS — E119 Type 2 diabetes mellitus without complications: Secondary | ICD-10-CM

## 2022-08-02 DIAGNOSIS — Z79899 Other long term (current) drug therapy: Secondary | ICD-10-CM

## 2022-08-02 DIAGNOSIS — E782 Mixed hyperlipidemia: Secondary | ICD-10-CM | POA: Diagnosis not present

## 2022-08-02 NOTE — Progress Notes (Signed)
Cardiology Office Note    Date:  08/02/2022   ID:  Ellen, Mcmillan 04-14-1946, MRN [id number]  PCP:  Virginia Rochester, NP  Cardiologist: Pixie Casino, MD    Chief Complaint  Patient presents with   Follow-up    4 month visit    History of Present Illness:    Ellen Mcmillan is a 76 y.o. female with past medical history of HTN, Type II DM and Schizophrenia who presents to the office today for 10-month follow-up.  She was last examined by Dr. Debara Pickett in 03/2022 as a new patient referral for evaluation of worsening lower extremity edema. Her symptoms were felt to be secondary to Type 2 DM, HTN and high-dose Amlodipine.  Amlodipine was reduced from 10 mg daily to 5 mg daily and Atenolol was stopped and switched to Coreg 12.5 mg twice daily with her being continued on Losartan-HCTZ and Spironolactone. She was also encouraged to utilize compression stockings. An echocardiogram was obtained which showed a preserved EF of 60 to 65% with no regional wall motion abnormalities. She did have mild LVH, normal RV function and trivial MR.  In talking with the patient and one of her caregivers from [facility] today, she reports overall feeling well since her last office visit. She denies any recent dyspnea on exertion, chest pain or palpitations.  She did previously have palpitations when diagnosed with hyperkalemia in the past. No specific orthopnea or PND. Her lower extremity edema significantly improved following dose reduction of Amlodipine and she has been wearing compression stockings on a daily basis. She is sedentary at baseline but is planning to exercise more routinely. Reports having bilateral foot pain which has been present for many years and unchanged.    Past Medical History:  Diagnosis Date   Diabetes mellitus without complication ([place])    Hypertension    Ovarian cyst    Schizophrenia ([place])     Past Surgical History:  Procedure Laterality Date   ABDOMINAL  HYSTERECTOMY     FRACTURE SURGERY      Current Medications: Outpatient Medications Prior to Visit  Medication Sig Dispense Refill   acetaminophen (TYLENOL) 325 MG tablet Take 650 mg by mouth every 6 (six) hours as needed for mild pain.     amLODipine (NORVASC) 5 MG tablet TAKE (1) TABLET BY MOUTH ONCE DAILY. 30 tablet 10   cetirizine (ZYRTEC) 10 MG tablet Take by mouth.     docusate sodium (COLACE) 100 MG capsule Take 100 mg by mouth at bedtime as needed for mild constipation.     fluticasone (FLONASE) 50 MCG/ACT nasal spray Place into both nostrils.     furosemide (LASIX) 40 MG tablet Take 40 mg by mouth daily.     hydrOXYzine (ATARAX/VISTARIL) 25 MG tablet Take 1 tablet (25 mg total) by mouth every 6 (six) hours. 12 tablet 0   Multiple Vitamin (MULTIVITAMIN) tablet Take 1 tablet by mouth daily.     potassium chloride SA (KLOR-CON M) 20 MEQ tablet Take 20 mEq by mouth daily.     simvastatin (ZOCOR) 20 MG tablet Take 20 mg by mouth daily.     spironolactone (ALDACTONE) 25 MG tablet Take 25 mg by mouth 2 (two) times daily.     ziprasidone (GEODON) 60 MG capsule Take 60-80 mg by mouth 2 (two) times daily with a meal. 60mg  in am, 80mg  at bedtime     carvedilol (COREG) 12.5 MG tablet TAKE (1) TABLET BY MOUTH TWICE DAILY. (Patient  not taking: Reported on 08/02/2022) 60 tablet 10   losartan-hydrochlorothiazide (HYZAAR) 100-25 MG tablet Take 1 tablet by mouth daily.     sulfamethoxazole-trimethoprim (BACTRIM) 400-80 MG tablet Take 1 tablet by mouth 2 (two) times daily. (Patient not taking: Reported on 12/28/2021) 56 tablet 0   No facility-administered medications prior to visit.     Allergies:   Patient has no known allergies.   Social History   Socioeconomic History   Marital status: Divorced    Spouse name: Not on file   Number of children: Not on file   Years of education: Not on file   Highest education level: Not on file  Occupational History   Not on file  Tobacco Use   Smoking  status: Never   Smokeless tobacco: Never  Vaping Use   Vaping Use: Never used  Substance and Sexual Activity   Alcohol use: No   Drug use: No   Sexual activity: Not on file  Other Topics Concern   Not on file  Social History Narrative   Not on file   Social Determinants of Health   Financial Resource Strain: Not on file  Food Insecurity: Not on file  Transportation Needs: Not on file  Physical Activity: Not on file  Stress: Not on file  Social Connections: Not on file     Family History:  The patient's family history includes Cancer in her father, mother, and another family member; Mental illness in her sister and another family member; Stroke in her mother and another family member.   Review of Systems:    Please see the history of present illness.     All other systems reviewed and are otherwise negative except as noted above.   Physical Exam:    VS:  BP (!) 142/78   Pulse 84   Wt 174 lb (78.9 kg)   SpO2 95%   BMI 29.87 kg/m    General: Well developed, well nourished,female appearing in no acute distress. Head: Normocephalic, atraumatic. Neck: No carotid bruits. JVD not elevated.  Lungs: Respirations regular and unlabored, without wheezes or rales.  Heart: Regular rate and rhythm. No S3 or S4.  No murmur, no rubs, or gallops appreciated. Abdomen: Appears non-distended. No obvious abdominal masses. Msk:  Strength and tone appear normal for age. No obvious joint deformities or effusions. Extremities: No clubbing or cyanosis. No pitting edema.  Distal pedal pulses are 2+ bilaterally. Neuro: Alert and oriented X 3. Moves all extremities spontaneously. No focal deficits noted. Psych:  Responds to questions appropriately with a normal affect. Skin: No rashes or lesions noted  Wt Readings from Last 3 Encounters:  08/02/22 174 lb (78.9 kg)  04/13/22 181 lb (82.1 kg)  12/24/18 148 lb (67.1 kg)     Studies/Labs Reviewed:   EKG:  EKG is not ordered today.   Recent  Labs: 12/28/2021: ALT 59; BUN 33; Creatinine, Ser 1.83; Hemoglobin 11.4; Platelets 295; Potassium 5.4; Sodium 140   Lipid Panel No results found for: "CHOL", "TRIG", "HDL", "CHOLHDL", "VLDL", "LDLCALC", "LDLDIRECT"  Additional studies/ records that were reviewed today include:   Echocardiogram: 05/04/2022 IMPRESSIONS     1. Left ventricular ejection fraction, by estimation, is 60 to 65%. The  left ventricle has normal function. The left ventricle has no regional  wall motion abnormalities. There is mild concentric left ventricular  hypertrophy. Left ventricular diastolic  parameters are indeterminate.   2. Right ventricular systolic function is normal. The right ventricular  size is normal. Tricuspid  regurgitation signal is inadequate for assessing  PA pressure.   3. The mitral valve is grossly normal. Trivial mitral valve  regurgitation. No evidence of mitral stenosis.   4. The aortic valve is tricuspid. There is mild calcification of the  aortic valve. There is mild thickening of the aortic valve. Aortic valve  regurgitation is trivial.   5. The inferior vena cava is normal in size with greater than 50%  respiratory variability, suggesting right atrial pressure of 3 mmHg.   Assessment:    1. Lower extremity edema   2. Essential hypertension   3. Medication management   4. Mixed hyperlipidemia   5. Type 2 diabetes mellitus treated without insulin (HCC)      Plan:   In order of problems listed above:  1. Lower Extremity Edema - Recent echocardiogram earlier this year was reassuring as outlined above and she has also been wearing compression stockings on a daily basis which has helped. Would also recommend continuing Amlodipine at her current dose of 5 mg daily as her prior dosing of 10 mg daily likely contributed to her worsening symptoms. - She has been continued on both Lasix 40 mg daily and HCTZ 25 mg daily by her PCP. Will recheck a BMET for reassessment of her  electrolytes and renal function.  2. HTN - Her BP was initially recorded at 166/56, rechecked and improved to 142/78. She reports this has been well controlled in the ambulatory setting. I have asked them to check her blood pressure at least once every few weeks and report back if this remains elevated. Continue current medical therapy for now with Amlodipine 5 mg daily, Coreg 12.5 mg twice daily, Lasix 40 mg daily, Losartan-HCTZ 100-25 mg daily and Spironolactone 25 mg daily. If BP remains above goal, would plan to further titrate Coreg.  3. HLD - Will request a copy of most recent labs from her PCP. She remains on Simvastatin 20 mg daily.  4. Type 2 DM - She reports having foot pain which is possibly due to neuropathy or arthritis. Will recheck a Hgb A1c with her upcoming labs. Has been diet-controlled thus far.    Medication Adjustments/Labs and Tests Ordered: Current medicines are reviewed at length with the patient today.  Concerns regarding medicines are outlined above.  Medication changes, Labs and Tests ordered today are listed in the Patient Instructions below. Patient Instructions  Medication Instructions:   Continue current medication regimen.   *If you need a refill on your cardiac medications before your next appointment, please call your pharmacy*   Lab Work:  Labs at [facility]  If you have labs (blood work) drawn today and your tests are completely normal, you will receive your results only by: MyChart Message (if you have MyChart) OR A paper copy in the mail If you have any lab test that is abnormal or we need to change your treatment, we will call you to review the results.  Follow-Up: At [facility], you and your health needs are our priority.  As part of our continuing mission to provide you with exceptional heart care, we have created designated Provider Care Teams.  These Care Teams include your primary Cardiologist (physician) and Advanced Practice  Providers (APPs -  Physician Assistants and Nurse Practitioners) who all work together to provide you with the care you need, when you need it.  We recommend signing up for the patient portal called "MyChart".  Sign up information is provided on this After Visit Summary.  MyChart is used to connect with patients for Virtual Visits (Telemedicine).  Patients are able to view lab/test results, encounter notes, upcoming appointments, etc.  Non-urgent messages can be sent to your provider as well.   To learn more about what you can do with MyChart, go to [email or web address].    Your next appointment:   6 month(s)  The format for your next appointment:   In Person  Provider:   You may see Chrystie Nose, MD or one of the following Advanced Practice Providers on your designated Care Team:   Randall An, PA-C  Jacolyn Reedy, PA-C      Important Information About Sugar         Signed, Ellsworth Lennox, PA-C  08/02/2022 4:15 PM    [facility] [address] [place], [place] [address] Phone: [phone] Fax: [phone]

## 2022-08-02 NOTE — Patient Instructions (Signed)
Medication Instructions:   Continue current medication regimen.   *If you need a refill on your cardiac medications before your next appointment, please call your pharmacy*   Lab Work:  Labs at [facility]  If you have labs (blood work) drawn today and your tests are completely normal, you will receive your results only by: [place] (if you have MyChart) OR A paper copy in the mail If you have any lab test that is abnormal or we need to change your treatment, we will call you to review the results.  Follow-Up: At [facility], you and your health needs are our priority.  As part of our continuing mission to provide you with exceptional heart care, we have created designated Provider Care Teams.  These Care Teams include your primary Cardiologist (physician) and Advanced Practice Providers (APPs -  Physician Assistants and Nurse Practitioners) who all work together to provide you with the care you need, when you need it.  We recommend signing up for the patient portal called "MyChart".  Sign up information is provided on this After Visit Summary.  MyChart is used to connect with patients for Virtual Visits (Telemedicine).  Patients are able to view lab/test results, encounter notes, upcoming appointments, etc.  Non-urgent messages can be sent to your provider as well.   To learn more about what you can do with MyChart, go to [email or web address].    Your next appointment:   6 month(s)  The format for your next appointment:   In Person  Provider:   You may see Pixie Casino, MD or one of the following Advanced Practice Providers on your designated Care Team:   Bernerd Pho, PA-C  Ermalinda Barrios, [place]      Important Information About Sugar

## 2022-08-16 ENCOUNTER — Encounter (HOSPITAL_COMMUNITY): Payer: Self-pay | Admitting: Emergency Medicine

## 2022-08-16 ENCOUNTER — Emergency Department (HOSPITAL_COMMUNITY): Payer: Medicare HMO

## 2022-08-16 ENCOUNTER — Other Ambulatory Visit: Payer: Self-pay

## 2022-08-16 ENCOUNTER — Emergency Department (HOSPITAL_COMMUNITY)
Admission: EM | Admit: 2022-08-16 | Discharge: 2022-08-16 | Disposition: A | Discharge status: Home / Self Care | Payer: Medicare HMO | Attending: Emergency Medicine | Admitting: Emergency Medicine

## 2022-08-16 DIAGNOSIS — E119 Type 2 diabetes mellitus without complications: Secondary | ICD-10-CM | POA: Diagnosis not present

## 2022-08-16 DIAGNOSIS — R4182 Altered mental status, unspecified: Secondary | ICD-10-CM | POA: Diagnosis not present

## 2022-08-16 DIAGNOSIS — I10 Essential (primary) hypertension: Secondary | ICD-10-CM | POA: Diagnosis not present

## 2022-08-16 DIAGNOSIS — R4689 Other symptoms and signs involving appearance and behavior: Secondary | ICD-10-CM | POA: Insufficient documentation

## 2022-08-16 DIAGNOSIS — F989 Unspecified behavioral and emotional disorders with onset usually occurring in childhood and adolescence: Secondary | ICD-10-CM | POA: Diagnosis not present

## 2022-08-16 DIAGNOSIS — Z79899 Other long term (current) drug therapy: Secondary | ICD-10-CM | POA: Diagnosis not present

## 2022-08-16 DIAGNOSIS — R531 Weakness: Secondary | ICD-10-CM | POA: Diagnosis not present

## 2022-08-16 DIAGNOSIS — R55 Syncope and collapse: Secondary | ICD-10-CM | POA: Diagnosis not present

## 2022-08-16 DIAGNOSIS — W19XXXA Unspecified fall, initial encounter: Secondary | ICD-10-CM | POA: Diagnosis not present

## 2022-08-16 LAB — CBC
HCT: 33.9 % — ABNORMAL LOW (ref 36.0–46.0)
Hemoglobin: 11.2 g/dL — ABNORMAL LOW (ref 12.0–15.0)
MCH: 32.1 pg (ref 26.0–34.0)
MCHC: 33 g/dL (ref 30.0–36.0)
MCV: 97.1 fL (ref 80.0–100.0)
Platelets: 264 10*3/uL (ref 150–400)
RBC: 3.49 MIL/uL — ABNORMAL LOW (ref 3.87–5.11)
RDW: 12 % (ref 11.5–15.5)
WBC: 6.3 10*3/uL (ref 4.0–10.5)
nRBC: 0 % (ref 0.0–0.2)

## 2022-08-16 LAB — BASIC METABOLIC PANEL
Anion gap: 7 (ref 5–15)
BUN: 32 mg/dL — ABNORMAL HIGH (ref 8–23)
CO2: 21 mmol/L — ABNORMAL LOW (ref 22–32)
Calcium: 9.2 mg/dL (ref 8.9–10.3)
Chloride: 109 mmol/L (ref 98–111)
Creatinine, Ser: 2.44 mg/dL — ABNORMAL HIGH (ref 0.44–1.00)
GFR, Estimated: 20 mL/min — ABNORMAL LOW (ref >60)
Glucose, Bld: 112 mg/dL — ABNORMAL HIGH (ref 70–99)
Potassium: 4.6 mmol/L (ref 3.5–5.1)
Sodium: 137 mmol/L (ref 135–145)

## 2022-08-16 LAB — AMMONIA: Ammonia: 14 umol/L (ref 9–35)

## 2022-08-16 LAB — ETHANOL: Alcohol, Ethyl (B): <10 mg/dL (ref <10)

## 2022-08-16 LAB — CBG MONITORING, ED: Glucose-Capillary: 112 mg/dL — ABNORMAL HIGH (ref 70–99)

## 2022-08-16 MED ORDER — SODIUM CHLORIDE 0.9 % IV BOLUS: 1000.0000 mL | Freq: Once | INTRAVENOUS | Status: DC

## 2022-08-16 NOTE — ED Notes (Signed)
Pt transport to CT  

## 2022-08-16 NOTE — ED Triage Notes (Signed)
Pt BIB [facility] from [facility] with reports of syncope. When speaking with the patient she is alert and oriented x 4. Pt will close her eyes and stop responding. Pt will throw herself to the right and left of the stretcher.

## 2022-08-16 NOTE — ED Provider Notes (Signed)
[facility] EMERGENCY DEPARTMENT Provider Note   CSN: [id number] Arrival date & time: 08/16/22  1815     History {Add pertinent medical, surgical, social history, OB history to HPI:1} Chief Complaint  Patient presents with   Loss of Consciousness    Ellen Mcmillan is a 76 y.o. female with history of diabetes, hypertension, schizophrenia, presenting from her group home by EMS with concern for behavioral changes.  EMS reports were called out to the home because the patient was intermittently catatonic, or appeared to be sleeping, and then was suddenly awakened and jolted violently, and then go back to being limp.  The patient demonstrated 1 of these episodes upon arrival, appeared to be sleeping on the stretcher, and then spasms violently once, then fell back asleep.  When I talk to the patient in the room, she is awake, eyes open, following commands, but will not speak to me.  HPI     Home Medications Prior to Admission medications   Medication Sig Start Date End Date Taking? Authorizing Provider  acetaminophen (TYLENOL) 325 MG tablet Take 650 mg by mouth every 6 (six) hours as needed for mild pain.    [provider]  amLODipine (NORVASC) 5 MG tablet TAKE (1) TABLET BY MOUTH ONCE DAILY. 06/29/22   Hilty, Nadean Corwin, MD  carvedilol (COREG) 12.5 MG tablet TAKE (1) TABLET BY MOUTH TWICE DAILY. Patient not taking: Reported on 08/02/2022 06/29/22   Pixie Casino, MD  cetirizine (ZYRTEC) 10 MG tablet Take by mouth. 07/25/22   [provider]  docusate sodium (COLACE) 100 MG capsule Take 100 mg by mouth at bedtime as needed for mild constipation.    [provider]  fluticasone (FLONASE) 50 MCG/ACT nasal spray Place into both nostrils. 07/01/22   [provider]  furosemide (LASIX) 40 MG tablet Take 40 mg by mouth daily. 03/04/22   [provider]  hydrOXYzine (ATARAX/VISTARIL) 25 MG tablet Take 1 tablet (25 mg total) by mouth every 6 (six) hours.  10/13/16   Daleen Bo, MD  losartan-hydrochlorothiazide (HYZAAR) 100-25 MG tablet Take 1 tablet by mouth daily.    [provider]  Multiple Vitamin (MULTIVITAMIN) tablet Take 1 tablet by mouth daily.    [provider]  potassium chloride SA (KLOR-CON M) 20 MEQ tablet Take 20 mEq by mouth daily. 03/04/22   [provider]  simvastatin (ZOCOR) 20 MG tablet Take 20 mg by mouth daily.    [provider]  spironolactone (ALDACTONE) 25 MG tablet Take 25 mg by mouth 2 (two) times daily. 10/18/18   [provider]  sulfamethoxazole-trimethoprim (BACTRIM) 400-80 MG tablet Take 1 tablet by mouth 2 (two) times daily. Patient not taking: Reported on 12/28/2021 03/26/20   Carole Civil, MD  ziprasidone (GEODON) 60 MG capsule Take 60-80 mg by mouth 2 (two) times daily with a meal. 60mg  in am, 80mg  at bedtime    [provider]      Allergies    Patient has no known allergies.    Review of Systems   Review of Systems  Physical Exam Updated Vital Signs BP (!) 156/61   Pulse 72   Resp 18   SpO2 97%  Physical Exam Constitutional:      General: She is not in acute distress. HENT:     Head: Normocephalic and atraumatic.  Eyes:     Conjunctiva/sclera: Conjunctivae normal.     Pupils: Pupils are equal, round, and reactive to light.  Cardiovascular:  Rate and Rhythm: Normal rate and regular rhythm.  Pulmonary:     Effort: Pulmonary effort is normal. No respiratory distress.  Abdominal:     General: There is no distension.     Tenderness: There is no abdominal tenderness.  Skin:    General: Skin is warm and dry.  Neurological:     Mental Status: She is alert.     Comments: Eyes open, moving all extremities to command, no rotary nystagmus.  Will not verbalize.  No evident cranial nerve deficits or drips     ED Results / Procedures / Treatments   Labs (all labs ordered are listed, but only abnormal results are displayed) Labs  Reviewed  BASIC METABOLIC PANEL  CBC  CBG MONITORING, ED    EKG None  Radiology No results found.  Procedures Procedures  {Document cardiac monitor, telemetry assessment procedure when appropriate:1}  Medications Ordered in ED Medications - No data to display  ED Course/ Medical Decision Making/ A&P                           Medical Decision Making Amount and/or Complexity of Data Reviewed Labs: ordered. Radiology: ordered. ECG/medicine tests: ordered.   This patient presents to the ED with concern for AMS. This involves an extensive number of treatment options, and is a complaint that carries with it a high risk of complications and morbidity.  The differential diagnosis includes ***  Co-morbidities that complicate the patient evaluation: ***  Additional history obtained from ***  External records from outside source obtained and reviewed including ***  I ordered and personally interpreted labs.  The pertinent results include:  ***  I ordered imaging studies including *** I independently visualized and interpreted imaging which showed *** I agree with the radiologist interpretation  The patient was maintained on a cardiac monitor.  I personally viewed and interpreted the cardiac monitored which showed an underlying rhythm of: ***  Per my interpretation the patient's ECG shows ***  I ordered medication including ***  for *** I have reviewed the patients home medicines and have made adjustments as needed  Test Considered: ***  I requested consultation with the ***,  and discussed lab and imaging findings as well as pertinent plan - they recommend: ***  After the interventions noted above, I reevaluated the patient and found that they have: {resolved/improved/worsened:23923::"improved"}  Social Determinants of Health:***  Dispostion:  After consideration of the diagnostic results and the patients response to treatment, I feel that the patent would benefit  from ***.   {Document critical care time when appropriate:1} {Document review of labs and clinical decision tools ie heart score, Chads2Vasc2 etc:1}  {Document your independent review of radiology images, and any outside records:1} {Document your discussion with family members, caretakers, and with consultants:1} {Document social determinants of health affecting pt's care:1} {Document your decision making why or why not admission, treatments were needed:1} Final Clinical Impression(s) / ED Diagnoses Final diagnoses:  None    Rx / DC Orders ED Discharge Orders     None

## 2022-08-16 NOTE — ED Notes (Signed)
Pt given water and snack to eat. Pt ambulatory to bathroom with no assistance needed.

## 2022-08-16 NOTE — Discharge Instructions (Signed)
The blood tests and CT scan of the brain for Ellen Mcmillan were normal for her - no emergency findings.  She was eating, walking, and talking normally in the ER.

## 2022-10-28 ENCOUNTER — Ambulatory Visit (INDEPENDENT_AMBULATORY_CARE_PROVIDER_SITE_OTHER): Payer: Medicare HMO | Admitting: Podiatry

## 2022-10-28 ENCOUNTER — Encounter: Payer: Self-pay | Admitting: Podiatry

## 2022-10-28 DIAGNOSIS — B351 Tinea unguium: Secondary | ICD-10-CM

## 2022-10-28 DIAGNOSIS — M79675 Pain in left toe(s): Secondary | ICD-10-CM | POA: Diagnosis not present

## 2022-10-28 DIAGNOSIS — M79674 Pain in right toe(s): Secondary | ICD-10-CM | POA: Diagnosis not present

## 2022-10-28 DIAGNOSIS — E1142 Type 2 diabetes mellitus with diabetic polyneuropathy: Secondary | ICD-10-CM | POA: Diagnosis not present

## 2022-10-28 DIAGNOSIS — E1140 Type 2 diabetes mellitus with diabetic neuropathy, unspecified: Secondary | ICD-10-CM | POA: Insufficient documentation

## 2022-10-28 NOTE — Progress Notes (Signed)

## 2023-01-03 DIAGNOSIS — H524 Presbyopia: Secondary | ICD-10-CM | POA: Diagnosis not present

## 2023-01-11 ENCOUNTER — Ambulatory Visit: Payer: Medicare HMO | Attending: Medical | Admitting: Medical

## 2023-01-11 NOTE — Progress Notes (Deleted)
Cardiology Office Note:    Date:  01/11/2023   ID:  [name], DOB 02/06/1946, MRN [id number]  PCP:  Virginia Rochester, NP  [facility] Cardiologist:  Pixie Casino, MD  [facility] Electrophysiologist:  None   Referring MD: Virginia Rochester, NP   Chief Complaint: ***  History of Present Illness:    ROSHON Mcmillan is a 77 y.o. female with a hx of HTN, DM2, schizophrenia who presents for follow-up.  Patient was initially seen in 03/2022 for lower leg edema. Symptoms were felt to secondary to DM2, HTN, and high dose amlodipine. Amlodipine was reduced from 10mg  to 50mg  daily and atenolol was stopped and switched to Coreg. Echo was ordered, which showed LVEF 60-65%, no WMA, mild LVH and trivial MR.   The patient was last seen 07/2022 and was overall feeling well. She was on both HCTZ and lasix.   Today,   Past Medical History:  Diagnosis Date   Diabetes mellitus without complication ([place])    Hypertension    Ovarian cyst    Schizophrenia [facility])     Past Surgical History:  Procedure Laterality Date   ABDOMINAL HYSTERECTOMY     FRACTURE SURGERY      Current Medications: No outpatient medications have been marked as taking for the 01/11/23 encounter (Appointment) with Kathlen Mody, Seleni Meller H, PA-C.     Allergies:   Patient has no known allergies.   Social History   Socioeconomic History   Marital status: Divorced    Spouse name: Not on file   Number of children: Not on file   Years of education: Not on file   Highest education level: Not on file  Occupational History   Not on file  Tobacco Use   Smoking status: Never   Smokeless tobacco: Never  Vaping Use   Vaping Use: Never used  Substance and Sexual Activity   Alcohol use: No   Drug use: No   Sexual activity: Not on file  Other Topics Concern   Not on file  Social History Narrative   Not on file   Social Determinants of Health   Financial Resource Strain: Not on file  Food Insecurity: Not on file   Transportation Needs: Not on file  Physical Activity: Not on file  Stress: Not on file  Social Connections: Not on file     Family History: The patient's family history includes Cancer in her father, mother, and another family member; Mental illness in her sister and another family member; Stroke in her mother and another family member.  ROS:   Please see the history of present illness.     All other systems reviewed and are negative.  EKGs/Labs/Other Studies Reviewed:    The following studies were reviewed today:  Echo 04/2022 1. Left ventricular ejection fraction, by estimation, is 60 to 65%. The  left ventricle has normal function. The left ventricle has no regional  wall motion abnormalities. There is mild concentric left ventricular  hypertrophy. Left ventricular diastolic  parameters are indeterminate.   2. Right ventricular systolic function is normal. The right ventricular  size is normal. Tricuspid regurgitation signal is inadequate for assessing  PA pressure.   3. The mitral valve is grossly normal. Trivial mitral valve  regurgitation. No evidence of mitral stenosis.   4. The aortic valve is tricuspid. There is mild calcification of the  aortic valve. There is mild thickening of the aortic valve. Aortic valve  regurgitation is trivial.   5. The  inferior vena cava is normal in size with greater than 50%  respiratory variability, suggesting right atrial pressure of 3 mmHg.   EKG:  EKG is *** ordered today.  The ekg ordered today demonstrates ***  Recent Labs: 08/16/2022: BUN 32; Creatinine, Ser 2.44; Hemoglobin 11.2; Platelets 264; Potassium 4.6; Sodium 137  Recent Lipid Panel No results found for: "CHOL", "TRIG", "HDL", "CHOLHDL", "VLDL", "LDLCALC", "LDLDIRECT"   Risk Assessment/Calculations:   {Does this patient have ATRIAL FIBRILLATION?[phone]   Physical Exam:    VS:  There were no vitals taken for this visit.    Wt Readings from Last 3 Encounters:   08/02/22 174 lb (78.9 kg)  04/13/22 181 lb (82.1 kg)  12/24/18 148 lb (67.1 kg)     GEN: *** Well nourished, well developed in no acute distress HEENT: Normal NECK: No JVD; No carotid bruits LYMPHATICS: No lymphadenopathy CARDIAC: ***RRR, no murmurs, rubs, gallops RESPIRATORY:  Clear to auscultation without rales, wheezing or rhonchi  ABDOMEN: Soft, non-tender, non-distended MUSCULOSKELETAL:  No edema; No deformity  SKIN: Warm and dry NEUROLOGIC:  Alert and oriented x 3 PSYCHIATRIC:  Normal affect   ASSESSMENT:    No diagnosis found. PLAN:    In order of problems listed above:  Lower extremity edema  HTN  HLD  DM2  Disposition: Follow up {follow up:15908} with ***   Shared Decision Making/Informed Consent   {Are you ordering a CV Procedure (e.g. stress test, cath, DCCV, TEE, etc)?   Press F2        :[id number]    Signed, Atilla Zollner Arlyss Repress  01/11/2023 10:38 AM    [facility]

## 2023-01-12 ENCOUNTER — Encounter: Payer: Self-pay | Admitting: Medical

## 2023-02-24 ENCOUNTER — Ambulatory Visit: Payer: Medicare HMO | Admitting: Podiatry

## 2023-03-07 ENCOUNTER — Ambulatory Visit (INDEPENDENT_AMBULATORY_CARE_PROVIDER_SITE_OTHER): Payer: Medicare HMO | Admitting: Podiatry

## 2023-03-07 DIAGNOSIS — F209 Schizophrenia, unspecified: Secondary | ICD-10-CM | POA: Diagnosis not present

## 2023-03-07 DIAGNOSIS — B351 Tinea unguium: Secondary | ICD-10-CM

## 2023-03-07 DIAGNOSIS — M25571 Pain in right ankle and joints of right foot: Secondary | ICD-10-CM

## 2023-03-07 DIAGNOSIS — M79675 Pain in left toe(s): Secondary | ICD-10-CM | POA: Diagnosis not present

## 2023-03-07 DIAGNOSIS — M19071 Primary osteoarthritis, right ankle and foot: Secondary | ICD-10-CM

## 2023-03-07 DIAGNOSIS — M79674 Pain in right toe(s): Secondary | ICD-10-CM | POA: Diagnosis not present

## 2023-03-07 DIAGNOSIS — E119 Type 2 diabetes mellitus without complications: Secondary | ICD-10-CM | POA: Diagnosis not present

## 2023-03-07 NOTE — Progress Notes (Signed)
       Subjective:  Patient ID: Ellen Mcmillan, female    DOB: [birth year],  MRN: [id number]   Ellen Mcmillan presents to clinic today for:  Chief Complaint  Patient presents with   Nail Problem    Rm 22 Bilateral nail discoloration. Nail fungus. Nail thickness x 4 months.   . Patient notes nails are thick, discolored, elongated and painful in shoegear when trying to ambulate.  She presents with a caregiver today.  She notes that she has some swelling, more notably at the right ankle.  She has a history of an ankle fracture many years ago.  She states that it swells on a daily basis.  She does wear compression stockings to control swelling at the ankles and legs.  She does not have them on today because of today's appointment.  The patient does have diabetes  PCP is Ellen Mcmillan (Inactive).  No Known Allergies  Review of Systems: Negative except as noted in the HPI.  Objective:  There were no vitals filed for this visit.  Ellen Mcmillan is a pleasant 77 y.o. female in NAD. AAO x 3.  Vascular Examination: Capillary refill time is 3-5 seconds to toes bilateral. Palpable pedal pulses b/l LE. Digital hair present b/l.  Skin temperature gradient WNL b/l. No varicosities b/l. No cyanosis or clubbing noted b/l.  There is +1 pitting edema on the right ankle and lower leg area.  This is slightly less pronounced on the left ankle and leg.  No erythema or cellulitis.  Dermatological Examination: Pedal skin with normal turgor, texture and tone b/l. No open wounds. No interdigital macerations b/l. Toenails x10 are 3mm thick, discolored, dystrophic with subungual debris. There is pain with compression of the nail plates.  They are elongated x10  Neurological Examination: Protective sensation intact bilateral LE. Vibratory sensation intact bilateral LE.  Musculoskeletal Examination: Muscle strength 5/5 to all LE muscle groups b/l.  There is decreased range of motion at the right ankle.  No  crepitus.  Mildly decreased subtalar joint range of motion on the right.  Assessment/Plan: 1. Pain due to onychomycosis of toenails of both feet   2. Arthritis of right ankle   3. Arthralgia of right ankle     Discussed clinical findings with patient.  Since she already normally wears compression knee-high stockings, I did not recommend an additional elastic ankle sleeve for the right.  She may be a candidate for an [place] brace if she notes significant discomfort to the area to help stabilize the ankle joint.  She will continue with sneakers, even though she is wearing a croc style slip on shoe today.  Recommend Voltaren gel twice daily to the right ankle.  The mycotic toenails were sharply debrided x10 with sterile nail nippers and a power debriding burr to decrease bulk/thickness and length.    Return in about 3 months (around 06/07/2023) for [facility].   Clerance Lav, DPM, FACFAS [facility]     [address] [place], [place] [address]                Office [phone]  Fax [phone]

## 2023-05-09 DIAGNOSIS — Z5181 Encounter for therapeutic drug level monitoring: Secondary | ICD-10-CM | POA: Diagnosis not present

## 2023-05-09 DIAGNOSIS — F250 Schizoaffective disorder, bipolar type: Secondary | ICD-10-CM | POA: Diagnosis not present

## 2023-05-12 DIAGNOSIS — Z5181 Encounter for therapeutic drug level monitoring: Secondary | ICD-10-CM | POA: Diagnosis not present

## 2023-05-17 DIAGNOSIS — Z5181 Encounter for therapeutic drug level monitoring: Secondary | ICD-10-CM | POA: Diagnosis not present

## 2023-05-17 DIAGNOSIS — F250 Schizoaffective disorder, bipolar type: Secondary | ICD-10-CM | POA: Diagnosis not present

## 2023-05-25 DIAGNOSIS — Z5181 Encounter for therapeutic drug level monitoring: Secondary | ICD-10-CM | POA: Diagnosis not present

## 2023-05-31 DIAGNOSIS — Z5181 Encounter for therapeutic drug level monitoring: Secondary | ICD-10-CM | POA: Diagnosis not present

## 2023-05-31 DIAGNOSIS — F250 Schizoaffective disorder, bipolar type: Secondary | ICD-10-CM | POA: Diagnosis not present

## 2023-06-02 DIAGNOSIS — Z5181 Encounter for therapeutic drug level monitoring: Secondary | ICD-10-CM | POA: Diagnosis not present

## 2023-06-06 DIAGNOSIS — Z5181 Encounter for therapeutic drug level monitoring: Secondary | ICD-10-CM | POA: Diagnosis not present

## 2023-06-06 DIAGNOSIS — F250 Schizoaffective disorder, bipolar type: Secondary | ICD-10-CM | POA: Diagnosis not present

## 2023-06-09 DIAGNOSIS — Z5181 Encounter for therapeutic drug level monitoring: Secondary | ICD-10-CM | POA: Diagnosis not present

## 2023-06-12 DIAGNOSIS — J111 Influenza due to unidentified influenza virus with other respiratory manifestations: Secondary | ICD-10-CM | POA: Diagnosis not present

## 2023-06-19 DIAGNOSIS — J111 Influenza due to unidentified influenza virus with other respiratory manifestations: Secondary | ICD-10-CM | POA: Diagnosis not present

## 2023-06-29 DIAGNOSIS — Z5181 Encounter for therapeutic drug level monitoring: Secondary | ICD-10-CM | POA: Diagnosis not present

## 2023-06-29 DIAGNOSIS — F250 Schizoaffective disorder, bipolar type: Secondary | ICD-10-CM | POA: Diagnosis not present

## 2023-06-30 DIAGNOSIS — Z5181 Encounter for therapeutic drug level monitoring: Secondary | ICD-10-CM | POA: Diagnosis not present

## 2023-07-03 DIAGNOSIS — J111 Influenza due to unidentified influenza virus with other respiratory manifestations: Secondary | ICD-10-CM | POA: Diagnosis not present

## 2023-07-04 ENCOUNTER — Ambulatory Visit (INDEPENDENT_AMBULATORY_CARE_PROVIDER_SITE_OTHER): Payer: Medicare HMO | Admitting: Podiatry

## 2023-07-04 DIAGNOSIS — M79674 Pain in right toe(s): Secondary | ICD-10-CM

## 2023-07-04 DIAGNOSIS — B351 Tinea unguium: Secondary | ICD-10-CM

## 2023-07-04 DIAGNOSIS — M79675 Pain in left toe(s): Secondary | ICD-10-CM

## 2023-07-04 NOTE — Progress Notes (Unsigned)
       Subjective:  Patient ID: Ellen Mcmillan, female    DOB: July 23, 1946,  MRN: [id number]   Ellen Mcmillan presents to clinic today for:  Chief Complaint  Patient presents with   Nail Problem    dfc   Patient notes nails are thick, discolored, elongated and painful in shoegear when trying to ambulate.    Past Medical History:  Diagnosis Date   Diabetes mellitus without complication ([facility])    Hypertension    Ovarian cyst    Schizophrenia ([facility])     Past Surgical History:  Procedure Laterality Date   ABDOMINAL HYSTERECTOMY     FRACTURE SURGERY      No Known Allergies  Review of Systems: Negative except as noted in the HPI.  Objective:  There were no vitals filed for this visit.  Ellen Mcmillan is a pleasant 77 y.o. female in NAD. AAO x 3.  Vascular Examination: Capillary refill time is 3-5 seconds to toes bilateral. Palpable pedal pulses b/l LE. Digital hair present b/l.  Skin temperature gradient WNL b/l. No varicosities b/l. No cyanosis noted b/l.   Dermatological Examination: Pedal skin with normal turgor, texture and tone b/l. No open wounds. No interdigital macerations b/l. Toenails x10 are 3mm thick, discolored, dystrophic with subungual debris. There is pain with compression of the nail plates.  They are elongated x10  Assessment/Plan: 1. Pain due to onychomycosis of toenails of both feet    The mycotic toenails were sharply debrided x10 with sterile nail nippers and a power debriding burr to decrease bulk/thickness and length.    Return in about 3 months (around 10/03/2023) for [facility].   Clerance Lav, DPM, FACFAS [facility]     [address] [place], [place] [address]                Office [phone]  Fax [phone]

## 2023-07-06 DIAGNOSIS — Z5181 Encounter for therapeutic drug level monitoring: Secondary | ICD-10-CM | POA: Diagnosis not present

## 2023-07-06 DIAGNOSIS — F250 Schizoaffective disorder, bipolar type: Secondary | ICD-10-CM | POA: Diagnosis not present

## 2023-07-07 DIAGNOSIS — Z5181 Encounter for therapeutic drug level monitoring: Secondary | ICD-10-CM | POA: Diagnosis not present

## 2023-07-13 DIAGNOSIS — Z5181 Encounter for therapeutic drug level monitoring: Secondary | ICD-10-CM | POA: Diagnosis not present

## 2023-07-14 DIAGNOSIS — J111 Influenza due to unidentified influenza virus with other respiratory manifestations: Secondary | ICD-10-CM | POA: Diagnosis not present

## 2023-07-14 DIAGNOSIS — Z5181 Encounter for therapeutic drug level monitoring: Secondary | ICD-10-CM | POA: Diagnosis not present

## 2023-07-20 DIAGNOSIS — Z5181 Encounter for therapeutic drug level monitoring: Secondary | ICD-10-CM | POA: Diagnosis not present

## 2023-08-09 ENCOUNTER — Emergency Department (HOSPITAL_COMMUNITY)
Admission: EM | Admit: 2023-08-09 | Discharge: 2023-08-09 | Disposition: A | Discharge status: Home / Self Care | Payer: Medicare HMO | Attending: Emergency Medicine | Admitting: Emergency Medicine

## 2023-08-09 ENCOUNTER — Encounter (HOSPITAL_COMMUNITY): Payer: Self-pay

## 2023-08-09 DIAGNOSIS — T171XXA Foreign body in nostril, initial encounter: Secondary | ICD-10-CM | POA: Insufficient documentation

## 2023-08-09 DIAGNOSIS — M795 Residual foreign body in soft tissue: Secondary | ICD-10-CM

## 2023-08-09 DIAGNOSIS — W44E4XA Non-magnetic metal jewelry entering into or through a natural orifice, initial encounter: Secondary | ICD-10-CM | POA: Insufficient documentation

## 2023-08-09 MED ORDER — LIDOCAINE HCL (PF) 2 % IJ SOLN: 10.0000 mL | Freq: Once | INTRAMUSCULAR | Status: AC

## 2023-08-09 MED ORDER — POVIDONE-IODINE 10 % EX SOLN
CUTANEOUS | Status: DC | PRN
  Filled 2023-08-09: qty 14.8

## 2023-08-09 MED ORDER — LIDOCAINE HCL (PF) 2 % IJ SOLN
INTRAMUSCULAR | Status: AC
  Administered 2023-08-09: 10 mL
  Filled 2023-08-09: qty 10

## 2023-08-09 NOTE — ED Provider Notes (Signed)
[place] EMERGENCY DEPARTMENT AT [facility] Provider Note   CSN: [id number] Arrival date & time: 08/09/23  1056     History  Chief Complaint  Patient presents with   Foreign Body in Nose    Ellen Mcmillan is a 77 y.o. female senting for evaluation of complication from her left nose piercing.  She had her nose pierced in May 5 months ago and noticed several days ago that the external Sheryle Hail was no longer present.  She initially thought the entire piercing had fallen out, but then noticed that she still has the internal post present making her concerned that the remainder of the earring is inside of her skin.  There is been no drainage from the site, she has tried to pull on it but it was too tender to try to remove it herself.   The history is provided by the patient.       Home Medications Prior to Admission medications   Medication Sig Start Date End Date Taking? Authorizing Provider  acetaminophen (TYLENOL) 325 MG tablet Take 650 mg by mouth every 6 (six) hours as needed for mild pain.    [provider]  amLODipine (NORVASC) 5 MG tablet TAKE (1) TABLET BY MOUTH ONCE DAILY. Patient taking differently: Take 5 mg by mouth daily. 06/29/22   Hilty, Lisette Abu, MD  carvedilol (COREG) 12.5 MG tablet TAKE (1) TABLET BY MOUTH TWICE DAILY. Patient taking differently: Take 12.5 mg by mouth 2 (two) times daily with a meal. 06/29/22   Hilty, Lisette Abu, MD  cetirizine (ZYRTEC) 10 MG tablet Take by mouth. 07/25/22   [provider]  fluticasone (FLONASE) 50 MCG/ACT nasal spray Place into both nostrils. 07/01/22   [provider]  losartan-hydrochlorothiazide (HYZAAR) 100-25 MG tablet Take 1 tablet by mouth daily.    [provider]  Multiple Vitamin (MULTIVITAMIN) tablet Take 1 tablet by mouth daily.    [provider]  simvastatin (ZOCOR) 20 MG tablet Take 20 mg by mouth daily.    [provider]  spironolactone (ALDACTONE) 25 MG  tablet Take 25 mg by mouth 2 (two) times daily. 10/18/18   [provider]  sulfamethoxazole-trimethoprim (BACTRIM) 400-80 MG tablet Take 1 tablet by mouth 2 (two) times daily. 03/26/20   Vickki Hearing, MD  ziprasidone (GEODON) 60 MG capsule Take 60 mg by mouth in the morning. 60mg  in am, 80mg  at bedtime    [provider]  ziprasidone (GEODON) 80 MG capsule Take 80 mg by mouth at bedtime. 07/25/22   [provider]      Allergies    Patient has no known allergies.    Review of Systems   Review of Systems  Constitutional:  Negative for fever.  HENT:  Negative for congestion and sore throat.   Eyes: Negative.   Respiratory: Negative.    Gastrointestinal: Negative.   Genitourinary: Negative.   Musculoskeletal:  Negative for arthralgias.  Skin:  Positive for wound. Negative for rash.  Neurological: Negative.   Psychiatric/Behavioral: Negative.      Physical Exam Updated Vital Signs BP (!) 168/60   Pulse 72   Temp 98.2 F (36.8 C) (Oral)   Resp 16   Ht 5\' 4"  (1.626 m)   Wt 78.9 kg   SpO2 97%   BMI 29.87 kg/m  Physical Exam Constitutional:      General: She is not in acute distress.    Appearance: She is well-developed.  HENT:  Head: Normocephalic.  Cardiovascular:     Rate and Rhythm: Normal rate.  Pulmonary:     Effort: Pulmonary effort is normal.  Musculoskeletal:        General: Normal range of motion.     Cervical back: Neck supple.  Skin:    Comments: Palpable small foreign body noted left distal lateral nostril.  The post from the piercing is easily visualized within the nasal passage.     ED Results / Procedures / Treatments   Labs (all labs ordered are listed, but only abnormal results are displayed) Labs Reviewed - No data to display  EKG None  Radiology No results found.  Procedures Procedures    Diagnosis: Foreign Body - Location: left nostril Procedure: Foreign body removal Type of extraction: simple Type  of Repair: secondary intention Reason for Type of Repair: small chronic piercing remains only.  Informed consent:  Discussed the risks (permanent scarring, light or dark discoloration, infection, pain, bleeding, bruising, redness, blister formation, and recurrence of the lesion) and the benefits of the procedure, as well as the alternatives.  Informed consent was obtained. Anesthesia: lidocaine 2%  - 2 cc The area was prepared and draped in a standard fashion. It was extracted by forceps retrieval. Meticulous hemostasis was obtained with  no bleeding occurred . Repair: Secondary Intention Healing Various closure modalities were discussed with the patient, and secondary intention healing was chosen. Ointment and dressing were applied. The specimen was sent for pathologic examination.  The patient tolerated the procedure well. The patient was instructed on post-op care.    Once local anesthesia obtained,  pt tolerate pressure applied to the piercing wire and the diamond stud popped through the piercing hole.  The entire earring was removed without difficulty.  No incision was necessary.   Medications Ordered in ED Medications  povidone-iodine (BETADINE) 10 % external solution (has no administration in time range)  lidocaine HCl (PF) (XYLOCAINE) 2 % injection 10 mL (10 mLs Other Given 08/09/23 1320)    ED Course/ Medical Decision Making/ A&P                                 Medical Decision Making Risk OTC drugs. Prescription drug management.           Final Clinical Impression(s) / ED Diagnoses Final diagnoses:  Foreign body (FB) in soft tissue    Rx / DC Orders ED Discharge Orders     None         Victoriano Lain 08/09/23 1645    Bethann Berkshire, MD 08/10/23 1640

## 2023-08-09 NOTE — Discharge Instructions (Signed)
I do not recommend getting a nasal piercing, at least until this current piercing has had a chance to heal.  There is no sign of infection at the site.

## 2023-08-09 NOTE — ED Notes (Signed)
Pt has hx of hypertension. Took medication this am

## 2023-08-09 NOTE — ED Triage Notes (Signed)
Pt reports she had her nose pierced and the piercing is stuck in her nose.  Denies pain.   On assessment, the outer hole looks to be grown over, but the bar is still hanging in her nose.

## 2023-08-18 DIAGNOSIS — F209 Schizophrenia, unspecified: Secondary | ICD-10-CM | POA: Diagnosis not present

## 2023-09-17 ENCOUNTER — Emergency Department (HOSPITAL_COMMUNITY)
Admission: EM | Admit: 2023-09-17 | Discharge: 2023-09-17 | Disposition: A | Discharge status: Home / Self Care | Payer: Medicare HMO | Attending: Student | Admitting: Student

## 2023-09-17 ENCOUNTER — Other Ambulatory Visit: Payer: Self-pay

## 2023-09-17 ENCOUNTER — Emergency Department (HOSPITAL_COMMUNITY): Payer: Medicare HMO

## 2023-09-17 DIAGNOSIS — R109 Unspecified abdominal pain: Secondary | ICD-10-CM | POA: Diagnosis not present

## 2023-09-17 DIAGNOSIS — E119 Type 2 diabetes mellitus without complications: Secondary | ICD-10-CM | POA: Insufficient documentation

## 2023-09-17 DIAGNOSIS — R1033 Periumbilical pain: Secondary | ICD-10-CM | POA: Diagnosis not present

## 2023-09-17 DIAGNOSIS — Z79899 Other long term (current) drug therapy: Secondary | ICD-10-CM | POA: Diagnosis not present

## 2023-09-17 DIAGNOSIS — Z90710 Acquired absence of both cervix and uterus: Secondary | ICD-10-CM | POA: Diagnosis not present

## 2023-09-17 DIAGNOSIS — I10 Essential (primary) hypertension: Secondary | ICD-10-CM | POA: Insufficient documentation

## 2023-09-17 DIAGNOSIS — R1084 Generalized abdominal pain: Secondary | ICD-10-CM | POA: Diagnosis not present

## 2023-09-17 DIAGNOSIS — R1013 Epigastric pain: Secondary | ICD-10-CM | POA: Diagnosis not present

## 2023-09-17 LAB — CBC WITH DIFFERENTIAL/PLATELET
Abs Immature Granulocytes: 0.03 10*3/uL (ref 0.00–0.07)
Basophils Absolute: 0 10*3/uL (ref 0.0–0.1)
Basophils Relative: 0 %
Eosinophils Absolute: 0 10*3/uL (ref 0.0–0.5)
Eosinophils Relative: 0 %
HCT: 33.9 % — ABNORMAL LOW (ref 36.0–46.0)
Hemoglobin: 11.3 g/dL — ABNORMAL LOW (ref 12.0–15.0)
Immature Granulocytes: 0 %
Lymphocytes Relative: 32 %
Lymphs Abs: 3 10*3/uL (ref 0.7–4.0)
MCH: 31.7 pg (ref 26.0–34.0)
MCHC: 33.3 g/dL (ref 30.0–36.0)
MCV: 95 fL (ref 80.0–100.0)
Monocytes Absolute: 0.9 10*3/uL (ref 0.1–1.0)
Monocytes Relative: 9 %
Neutro Abs: 5.4 10*3/uL (ref 1.7–7.7)
Neutrophils Relative %: 59 %
Platelets: 335 10*3/uL (ref 150–400)
RBC: 3.57 MIL/uL — ABNORMAL LOW (ref 3.87–5.11)
RDW: 11.9 % (ref 11.5–15.5)
WBC: 9.3 10*3/uL (ref 4.0–10.5)
nRBC: 0 % (ref 0.0–0.2)

## 2023-09-17 LAB — URINALYSIS, W/ REFLEX TO CULTURE (INFECTION SUSPECTED)
Bacteria, UA: NONE SEEN
Bilirubin Urine: NEGATIVE
Glucose, UA: NEGATIVE mg/dL
Hgb urine dipstick: NEGATIVE
Ketones, ur: NEGATIVE mg/dL
Leukocytes,Ua: NEGATIVE
Nitrite: NEGATIVE
Protein, ur: NEGATIVE mg/dL
Specific Gravity, Urine: 1.011 (ref 1.005–1.030)
pH: 5 (ref 5.0–8.0)

## 2023-09-17 LAB — COMPREHENSIVE METABOLIC PANEL
ALT: 21 U/L (ref 0–44)
AST: 22 U/L (ref 15–41)
Albumin: 3.7 g/dL (ref 3.5–5.0)
Alkaline Phosphatase: 82 U/L (ref 38–126)
Anion gap: 10 (ref 5–15)
BUN: 31 mg/dL — ABNORMAL HIGH (ref 8–23)
CO2: 20 mmol/L — ABNORMAL LOW (ref 22–32)
Calcium: 9.5 mg/dL (ref 8.9–10.3)
Chloride: 101 mmol/L (ref 98–111)
Creatinine, Ser: 1.83 mg/dL — ABNORMAL HIGH (ref 0.44–1.00)
GFR, Estimated: 28 mL/min — ABNORMAL LOW (ref >60)
Glucose, Bld: 113 mg/dL — ABNORMAL HIGH (ref 70–99)
Potassium: 4.4 mmol/L (ref 3.5–5.1)
Sodium: 131 mmol/L — ABNORMAL LOW (ref 135–145)
Total Bilirubin: 0.9 mg/dL (ref <1.2)
Total Protein: 8 g/dL (ref 6.5–8.1)

## 2023-09-17 LAB — LIPASE, BLOOD: Lipase: 53 U/L — ABNORMAL HIGH (ref 11–51)

## 2023-09-17 MED ORDER — DICYCLOMINE HCL 10 MG/ML IM SOLN
20.0000 mg | Freq: Once | INTRAMUSCULAR | Status: AC
  Administered 2023-09-17: 20 mg via INTRAMUSCULAR
  Filled 2023-09-17: qty 2

## 2023-09-17 MED ORDER — DICYCLOMINE HCL 10 MG PO CAPS: 10.0000 mg | ORAL_CAPSULE | Freq: Three times a day (TID) | ORAL | 0 refills | Status: DC

## 2023-09-17 MED ORDER — ONDANSETRON 4 MG PO TBDP: 4.0000 mg | ORAL_TABLET | Freq: Four times a day (QID) | ORAL | 0 refills | Status: DC | PRN

## 2023-09-17 MED ORDER — METRONIDAZOLE 500 MG PO TABS
500.0000 mg | ORAL_TABLET | Freq: Once | ORAL | Status: AC
  Administered 2023-09-17: 500 mg via ORAL
  Filled 2023-09-17: qty 1

## 2023-09-17 MED ORDER — DICYCLOMINE HCL 20 MG PO TABS: 20.0000 mg | ORAL_TABLET | Freq: Two times a day (BID) | ORAL | 0 refills | Status: DC

## 2023-09-17 MED ORDER — ONDANSETRON 4 MG PO TBDP
4.0000 mg | ORAL_TABLET | Freq: Once | ORAL | Status: AC
  Administered 2023-09-17: 4 mg via ORAL
  Filled 2023-09-17: qty 1

## 2023-09-17 MED ORDER — PANTOPRAZOLE SODIUM 40 MG PO TBEC: 40.0000 mg | DELAYED_RELEASE_TABLET | Freq: Every day | ORAL | 0 refills | Status: DC

## 2023-09-17 NOTE — ED Notes (Signed)
Patient transported to CT 

## 2023-09-17 NOTE — ED Triage Notes (Signed)
Pt BIB [facility], complaint of abdominal pain around to both sides. Vomited prior to EMS arrival and Nauseated during transport. Pain 8/10.

## 2023-09-17 NOTE — Discharge Instructions (Signed)
Was a pleasure taking care of you today.  You seen for abdominal pain.  Fortunately your lab work and your CT scan were all reassuring.  Since your pain is worse after eating we are going to give you a medication called dicyclomine which will help with intestinal cramping and nausea medicine as well as Protonix which will help decrease your stomach acid production.  Follow-up close with your primary care doctor and the GI doctor if you continue having pain.  Come back to the ER for new or worsening symptoms.

## 2023-09-17 NOTE — ED Provider Notes (Signed)
[place] EMERGENCY DEPARTMENT AT [facility] Provider Note   CSN: [id number] Arrival date & time: 09/17/23  1554     History  Chief Complaint  Patient presents with   Abdominal Pain    Ellen Mcmillan is a 77 y.o. female with a history of schizophrenia, diabetes mellitus, and hypertension who presents the ED today for abdominal pain.  Patient reports periumbilical pain that radiates to her sides for the past week with associated nausea.  Patient reports an episode of vomiting prior to EMS arrival today.  She denies fevers, dysuria, hematuria, or changes to her bowel habits.  The pain does not radiate to her back.  She has never experienced pain like this before.  Has a history of abdominal hysterectomy in the past.  She has tried taking Tylenol without any relief of symptoms.  No additional complaints or concerns at this time.    Home Medications Prior to Admission medications   Medication Sig Start Date End Date Taking? Authorizing Provider  acetaminophen (TYLENOL) 325 MG tablet Take 650 mg by mouth every 6 (six) hours as needed for mild pain.    [provider]  amLODipine (NORVASC) 5 MG tablet TAKE (1) TABLET BY MOUTH ONCE DAILY. Patient taking differently: Take 5 mg by mouth daily. 06/29/22   Hilty, Lisette Abu, MD  carvedilol (COREG) 12.5 MG tablet TAKE (1) TABLET BY MOUTH TWICE DAILY. Patient taking differently: Take 12.5 mg by mouth 2 (two) times daily with a meal. 06/29/22   Hilty, Lisette Abu, MD  cetirizine (ZYRTEC) 10 MG tablet Take by mouth. 07/25/22   [provider]  fluticasone (FLONASE) 50 MCG/ACT nasal spray Place into both nostrils. 07/01/22   [provider]  losartan-hydrochlorothiazide (HYZAAR) 100-25 MG tablet Take 1 tablet by mouth daily.    [provider]  Multiple Vitamin (MULTIVITAMIN) tablet Take 1 tablet by mouth daily.    [provider]  simvastatin (ZOCOR) 20 MG tablet Take 20 mg by mouth daily.    [provider]  spironolactone (ALDACTONE) 25 MG tablet Take 25 mg by mouth 2 (two) times daily. 10/18/18   [provider]  sulfamethoxazole-trimethoprim (BACTRIM) 400-80 MG tablet Take 1 tablet by mouth 2 (two) times daily. 03/26/20   Vickki Hearing, MD  ziprasidone (GEODON) 60 MG capsule Take 60 mg by mouth in the morning. 60mg  in am, 80mg  at bedtime    [provider]  ziprasidone (GEODON) 80 MG capsule Take 80 mg by mouth at bedtime. 07/25/22   [provider]      Allergies    Patient has no known allergies.    Review of Systems   Review of Systems  Gastrointestinal:  Positive for abdominal pain.  All other systems reviewed and are negative.   Physical Exam Updated Vital Signs BP (!) 147/64 (BP Location: Right Arm)   Pulse 75   Temp 98.4 F (36.9 C) (Oral)   Resp 16   Ht 5\' 4"  (1.626 m)   Wt 80.7 kg   SpO2 98%   BMI 30.55 kg/m  Physical Exam Vitals and nursing note reviewed.  Constitutional:      Appearance: Normal appearance.  HENT:     Head: Normocephalic and atraumatic.     Mouth/Throat:     Mouth: Mucous membranes are moist.  Eyes:     Conjunctiva/sclera: Conjunctivae normal.     Pupils: Pupils are equal, round, and reactive to light.  Cardiovascular:     Rate  and Rhythm: Normal rate and regular rhythm.     Pulses: Normal pulses.     Heart sounds: Normal heart sounds.  Pulmonary:     Effort: Pulmonary effort is normal.     Breath sounds: Normal breath sounds.  Abdominal:     Palpations: Abdomen is soft.     Tenderness: There is abdominal tenderness. There is no right CVA tenderness or left CVA tenderness.     Comments: Periumbilical tenderness to palpation without rebound or guarding  Musculoskeletal:        General: Normal range of motion.  Skin:    General: Skin is warm and dry.     Findings: No rash.  Neurological:     General: No focal deficit present.     Mental Status: She is alert.     Sensory: No sensory deficit.      Motor: No weakness.  Psychiatric:        Mood and Affect: Mood normal.        Behavior: Behavior normal.     ED Results / Procedures / Treatments   Labs (all labs ordered are listed, but only abnormal results are displayed) Labs Reviewed  LIPASE, BLOOD - Abnormal; Notable for the following components:      Result Value   Lipase 53 (*)    All other components within normal limits  COMPREHENSIVE METABOLIC PANEL - Abnormal; Notable for the following components:   Sodium 131 (*)    CO2 20 (*)    Glucose, Bld 113 (*)    BUN 31 (*)    Creatinine, Ser 1.83 (*)    GFR, Estimated 28 (*)    All other components within normal limits  CBC WITH DIFFERENTIAL/PLATELET - Abnormal; Notable for the following components:   RBC 3.57 (*)    Hemoglobin 11.3 (*)    HCT 33.9 (*)    All other components within normal limits  URINALYSIS, W/ REFLEX TO CULTURE (INFECTION SUSPECTED)    EKG None  Radiology CT ABDOMEN PELVIS WO CONTRAST  Result Date: 09/17/2023 CLINICAL DATA:  Acute abdominal pain EXAM: CT ABDOMEN AND PELVIS WITHOUT CONTRAST TECHNIQUE: Multidetector CT imaging of the abdomen and pelvis was performed following the standard protocol without IV contrast. RADIATION DOSE REDUCTION: This exam was performed according to the departmental dose-optimization program which includes automated exposure control, adjustment of the mA and/or kV according to patient size and/or use of iterative reconstruction technique. COMPARISON:  None Available. FINDINGS: Lower chest: There is atelectasis in the lung bases. Subpleural nodular densities are seen in the right lower lobe measuring up to 6 mm. Hepatobiliary: No focal liver abnormality is seen. No gallstones, gallbladder wall thickening, or biliary dilatation. Pancreas: Unremarkable. No pancreatic ductal dilatation or surrounding inflammatory changes. Spleen: Normal in size without focal abnormality. Adrenals/Urinary Tract: There is diffuse right renal  atrophy. The left kidney is normal in size. There is no hydronephrosis or urinary tract calculus. The adrenal glands and bladder are within normal limits. Stomach/Bowel: There is circumferential wall thickening and inflammation of the duodenal. There is no evidence for perforation or abscess. Otherwise, small bowel appears within normal limits. The appendix, colon and stomach are within normal limits. Vascular/Lymphatic: Aortic atherosclerosis. No enlarged abdominal or pelvic lymph nodes. Reproductive: Status post hysterectomy. No adnexal masses. Other: No abdominal wall hernia or abnormality. No abdominopelvic ascites. Musculoskeletal: No acute or significant osseous findings. IMPRESSION: 1. Findings compatible with duodenitis. No evidence for perforation or abscess. 2. Multiple pulmonary nodules. Most significant: Right  solid pulmonary nodule measuring 6 mm. Per Fleischner Society Guidelines, recommend a non-contrast Chest CT at 3-6 months, then consider another non-contrast Chest CT at 18-24 months. If patient is low risk for malignancy, non-contrast Chest CT at 18-24 months is optional. These guidelines do not apply to immunocompromised patients and patients with cancer. Follow up in patients with significant comorbidities as clinically warranted. For lung cancer screening, adhere to Lung-RADS guidelines. Reference: Radiology. 2017; 284(1):228-43. 3. Right renal atrophy. 4. Aortic atherosclerosis. Aortic Atherosclerosis (ICD10-I70.0). Electronically Signed   By: Darliss Cheney M.D.   On: 09/17/2023 18:55    Procedures Procedures: not indicated.   Medications Ordered in ED Medications  ondansetron (ZOFRAN-ODT) disintegrating tablet 4 mg (4 mg Oral Given 09/17/23 1822)  dicyclomine (BENTYL) injection 20 mg (20 mg Intramuscular Given 09/17/23 1822)    ED Course/ Medical Decision Making/ A&P                                 Medical Decision Making Amount and/or Complexity of Data Reviewed Labs:  ordered. Radiology: ordered.  Risk Prescription drug management.   This patient presents to the ED for concern of abdominal pain, this involves an extensive number of treatment options, and is a complaint that carries with it a high risk of complications and morbidity.   Differential diagnosis includes: gastritis, gastroenteritis, GERD, pancreatitis, IBS, IBD, hernia, colitis, etc,   Comorbidities  See HPI above   Additional History  Additional history obtained from prior records.   Lab Tests  I ordered and personally interpreted labs.  The pertinent results include:   CMP, lipase, and CBC are all within normal limits UA pending at shift change   Imaging Studies  I ordered imaging studies including CT abdomen/pelvis  I independently visualized and interpreted imaging which showed: Findings compatible with duodenitis.  No evidence of perforation or abscess.  Notable pulmonary nodules. I agree with the radiologist interpretation   Problem List / ED Course / Critical Interventions / Medication Management  Periumbilical pain I ordered medications including: Bentyl and Zofran for abdominal pain and nausea  Reevaluation of the patient after these medicines showed that the patient improved I have reviewed the patients home medicines and have made adjustments as needed   Social Determinants of Health  Access to healthcare   Test / Admission - Considered  Disposition pending urine. Care transferred [facility] PA-C at shift change.       Final Clinical Impression(s) / ED Diagnoses Final diagnoses:  None    Rx / DC Orders ED Discharge Orders     None         Maxwell Marion, PA-C 09/17/23 1920    Glendora Score, MD 09/18/23 1218

## 2023-09-17 NOTE — ED Provider Notes (Signed)
  Physical Exam  BP (!) 147/64 (BP Location: Right Arm)   Pulse 75   Temp 98.4 F (36.9 C) (Oral)   Resp 16   Ht 5\' 4"  (1.626 m)   Wt 80.7 kg   SpO2 98%   BMI 30.55 kg/m   Physical Exam  Procedures  Procedures  ED Course / MDM    Medical Decision Making Amount and/or Complexity of Data Reviewed Labs: ordered. Radiology: ordered.  Risk Prescription drug management.  77 year old female with history of schizophrenia, hypertension, diabetes.  Presents to ER for diffuse upper abdominal pain with nausea and vomiting, started several days ago.  Signout taken from [employer], PA-C.  Patient has had a reassuring workup so far, she had a reassuring CT scan, very mildly elevated lipase, no leukocytosis and baseline renal function.  Awaiting urinalysis which is yet to be collected.  Anticipate discharge home pending the urinalysis results. The urinalysis is normal.  Patient is feeling overall better after the Bentyl.  She does note to me that she has been eating a lot of junk food lately and thinks that could be related, her symptoms do get worse after eating.  There is no abdominal tenderness on palpation at this time.  Will prescribe small quantity of Bentyl and Protonix in case this is gastritis.  She was advised on PCP and GI follow-up and strict return precautions.      Josem Kaufmann 09/17/23 2135    Glendora Score, MD 09/18/23 [phone]

## 2023-09-19 ENCOUNTER — Encounter: Payer: Self-pay | Admitting: Gastroenterology

## 2023-09-19 ENCOUNTER — Ambulatory Visit (INDEPENDENT_AMBULATORY_CARE_PROVIDER_SITE_OTHER): Payer: Medicare HMO | Admitting: Gastroenterology

## 2023-09-19 VITALS — BP 137/72 | HR 73 | Temp 98.6°F | Ht 63.5 in | Wt 175.0 lb

## 2023-09-19 DIAGNOSIS — R112 Nausea with vomiting, unspecified: Secondary | ICD-10-CM | POA: Diagnosis not present

## 2023-09-19 DIAGNOSIS — K5900 Constipation, unspecified: Secondary | ICD-10-CM

## 2023-09-19 DIAGNOSIS — R1013 Epigastric pain: Secondary | ICD-10-CM | POA: Diagnosis not present

## 2023-09-19 DIAGNOSIS — D649 Anemia, unspecified: Secondary | ICD-10-CM

## 2023-09-19 DIAGNOSIS — R1010 Upper abdominal pain, unspecified: Secondary | ICD-10-CM | POA: Insufficient documentation

## 2023-09-19 DIAGNOSIS — R7989 Other specified abnormal findings of blood chemistry: Secondary | ICD-10-CM | POA: Diagnosis not present

## 2023-09-19 DIAGNOSIS — R933 Abnormal findings on diagnostic imaging of other parts of digestive tract: Secondary | ICD-10-CM | POA: Diagnosis not present

## 2023-09-19 MED ORDER — POLYETHYLENE GLYCOL 3350 17 GM/SCOOP PO POWD
ORAL | 5 refills | Status: DC
Start: 2023-09-19 — End: 2024-03-25

## 2023-09-19 MED ORDER — PANTOPRAZOLE SODIUM 40 MG PO TBEC: 40.0000 mg | DELAYED_RELEASE_TABLET | Freq: Every day | ORAL | 5 refills | Status: DC

## 2023-09-19 NOTE — Progress Notes (Signed)
GI Office Note    Referring Provider: Rush Farmer, NP Primary Care Physician:  Rush Farmer, NP  Primary Gastroenterologist: Hennie Duos. Marletta Lor, DO   Chief Complaint   Chief Complaint  Patient presents with   Abdominal Pain    Pt complaining of abd pain since Monday on top and sides of abd.no BM in 4 days. Pt states it hurts now     History of Present Illness   Ellen Mcmillan is a 77 y.o. female presenting today at the request of EDP for abdominal pain.  Patient has a history of schizophrenia, diabetes, hypertension who presents to the ED yesterday with periumbilical abdominal pain associated with nausea.  She had episode of vomiting prior to EMS arrival.  In the ED: Urinalysis was unremarkable.  Stable mild anemia with hemoglobin 11.3, MCV 95, platelets 335,000, sodium 131, BUN 31, creatinine 1.83, glucose 113, albumin 3.7, total bilirubin 0.9, alkaline phosphatase 82, AST 2, ALT 21, lipase 53.  CT abdomen pelvis without contrast September 17, 2023 showed circumferential wall thickening and inflammation of the duodenum consistent with duodenitis.  Multiple pulmonary nodules seen.  Right renal atrophy.  Usually has BM daily but last BM four days ago. Not eating well do to abdominal pain and nausea. Vomiting a few times, last time just before arriving to the ED. No hematemesis. No melena, brbpr. Abdominal pain in both upper flanks, and epigastric pain. Started just few days ago. No heartburn. No dysphagia. Today, was able to eat regular food.  Takes ibuprofen intermittently.  Colonoscopy 2007: -normal exam -return in 10 years  Medications   Current Outpatient Medications  Medication Sig Dispense Refill   acetaminophen (TYLENOL) 325 MG tablet Take 650 mg by mouth every 6 (six) hours as needed for mild pain.     amLODipine (NORVASC) 5 MG tablet TAKE (1) TABLET BY MOUTH ONCE DAILY. (Patient taking differently: Take 5 mg by mouth daily.) 30 tablet 10   carvedilol  (COREG) 12.5 MG tablet TAKE (1) TABLET BY MOUTH TWICE DAILY. (Patient taking differently: Take 12.5 mg by mouth 2 (two) times daily with a meal.) 60 tablet 10   cetirizine (ZYRTEC) 10 MG tablet Take by mouth.     fluticasone (FLONASE) 50 MCG/ACT nasal spray Place into both nostrils.     losartan-hydrochlorothiazide (HYZAAR) 100-25 MG tablet Take 1 tablet by mouth daily.     Multiple Vitamin (MULTIVITAMIN) tablet Take 1 tablet by mouth daily.     simvastatin (ZOCOR) 20 MG tablet Take 20 mg by mouth daily.     spironolactone (ALDACTONE) 25 MG tablet Take 25 mg by mouth 2 (two) times daily.     ziprasidone (GEODON) 60 MG capsule Take 60 mg by mouth in the morning. 60mg  in am, 80mg  at bedtime     dicyclomine (BENTYL) 10 MG capsule Take 1 capsule (10 mg total) by mouth 4 (four) times daily -  before meals and at bedtime. (Patient not taking: Reported on 09/19/2023) 6 capsule 0   ondansetron (ZOFRAN-ODT) 4 MG disintegrating tablet Take 1 tablet (4 mg total) by mouth every 6 (six) hours as needed for nausea or vomiting. (Patient not taking: Reported on 09/19/2023) 15 tablet 0   pantoprazole (PROTONIX) 40 MG tablet Take 1 tablet (40 mg total) by mouth daily. (Patient not taking: Reported on 09/19/2023) 30 tablet 0   No current facility-administered medications for this visit.    Allergies   Allergies as of 09/19/2023   (No Known Allergies)  Past Medical History   Past Medical History:  Diagnosis Date   Diabetes mellitus without complication (HCC)    Hypertension    Ovarian cyst    Schizophrenia (HCC)     Past Surgical History   Past Surgical History:  Procedure Laterality Date   ABDOMINAL HYSTERECTOMY     FRACTURE SURGERY Right    ankle   TUBAL LIGATION      Past Family History   Family History  Problem Relation Age of Onset   Kidney failure Mother 57   Stroke Mother    Cancer Father        lung   Mental illness Sister    Mental illness Other    Cancer Other    Stroke  Other     Past Social History   Social History   Socioeconomic History   Marital status: Divorced    Spouse name: Not on file   Number of children: Not on file   Years of education: Not on file   Highest education level: Not on file  Occupational History   Not on file  Tobacco Use   Smoking status: Never   Smokeless tobacco: Never  Vaping Use   Vaping status: Never Used  Substance and Sexual Activity   Alcohol use: No   Drug use: No   Sexual activity: Not on file  Other Topics Concern   Not on file  Social History Narrative   Not on file   Social Determinants of Health   Financial Resource Strain: Not on file  Food Insecurity: Not on file  Transportation Needs: Not on file  Physical Activity: Not on file  Stress: Not on file  Social Connections: Not on file  Intimate Partner Violence: Not on file    Review of Systems   General: Negative for anorexia, weight loss, fever, chills, fatigue, weakness. Eyes: Negative for vision changes.  ENT: Negative for hoarseness, difficulty swallowing , nasal congestion. CV: Negative for chest pain, angina, palpitations, dyspnea on exertion, peripheral edema.  Respiratory: Negative for dyspnea at rest, dyspnea on exertion, cough, sputum, wheezing.  GI: See history of present illness. GU:  Negative for dysuria, hematuria, urinary incontinence, urinary frequency, nocturnal urination.  MS: Negative for joint pain, low back pain.  Derm: Negative for rash or itching.  Neuro: Negative for weakness, abnormal sensation, seizure, frequent headaches, memory loss,  confusion.  Psych: Negative for anxiety, depression, suicidal ideation, hallucinations.  Endo: Negative for unusual weight change. Stays cold. Heme: Negative for bruising or bleeding. Allergy: Negative for rash or hives.  Physical Exam   BP 137/72   Pulse 73   Temp 98.6 F (37 C)   Ht 5' 3.5" (1.613 m)   Wt 175 lb (79.4 kg)   BMI 30.51 kg/m    General:  Well-nourished, well-developed in no acute distress.  Head: Normocephalic, atraumatic.   Eyes: Conjunctiva pink, no icterus. Mouth: Oropharyngeal mucosa moist and pink , no lesions erythema or exudate. Neck: Supple without thyromegaly, masses, or lymphadenopathy.  Lungs: Clear to auscultation bilaterally.  Heart: Regular rate and rhythm, no murmurs rubs or gallops.  Abdomen: Bowel sounds are normal,   nondistended, no hepatosplenomegaly or masses,  no abdominal bruits or hernia, no rebound or guarding.  Mild epigastric tenderness. Rectal: not performed Extremities: No lower extremity edema. No clubbing or deformities.  Neuro: Alert and oriented x 4 , grossly normal neurologically.  Skin: Warm and dry, no rash or jaundice.   Psych: Alert and cooperative, normal  mood and affect.  Labs   See hpi Imaging Studies   CT ABDOMEN PELVIS WO CONTRAST  Result Date: 09/17/2023 CLINICAL DATA:  Acute abdominal pain EXAM: CT ABDOMEN AND PELVIS WITHOUT CONTRAST TECHNIQUE: Multidetector CT imaging of the abdomen and pelvis was performed following the standard protocol without IV contrast. RADIATION DOSE REDUCTION: This exam was performed according to the departmental dose-optimization program which includes automated exposure control, adjustment of the mA and/or kV according to patient size and/or use of iterative reconstruction technique. COMPARISON:  None Available. FINDINGS: Lower chest: There is atelectasis in the lung bases. Subpleural nodular densities are seen in the right lower lobe measuring up to 6 mm. Hepatobiliary: No focal liver abnormality is seen. No gallstones, gallbladder wall thickening, or biliary dilatation. Pancreas: Unremarkable. No pancreatic ductal dilatation or surrounding inflammatory changes. Spleen: Normal in size without focal abnormality. Adrenals/Urinary Tract: There is diffuse right renal atrophy. The left kidney is normal in size. There is no hydronephrosis or urinary tract  calculus. The adrenal glands and bladder are within normal limits. Stomach/Bowel: There is circumferential wall thickening and inflammation of the duodenal. There is no evidence for perforation or abscess. Otherwise, small bowel appears within normal limits. The appendix, colon and stomach are within normal limits. Vascular/Lymphatic: Aortic atherosclerosis. No enlarged abdominal or pelvic lymph nodes. Reproductive: Status post hysterectomy. No adnexal masses. Other: No abdominal wall hernia or abnormality. No abdominopelvic ascites. Musculoskeletal: No acute or significant osseous findings. IMPRESSION: 1. Findings compatible with duodenitis. No evidence for perforation or abscess. 2. Multiple pulmonary nodules. Most significant: Right solid pulmonary nodule measuring 6 mm. Per Fleischner Society Guidelines, recommend a non-contrast Chest CT at 3-6 months, then consider another non-contrast Chest CT at 18-24 months. If patient is low risk for malignancy, non-contrast Chest CT at 18-24 months is optional. These guidelines do not apply to immunocompromised patients and patients with cancer. Follow up in patients with significant comorbidities as clinically warranted. For lung cancer screening, adhere to Lung-RADS guidelines. Reference: Radiology. 2017; 284(1):228-43. 3. Right renal atrophy. 4. Aortic atherosclerosis. Aortic Atherosclerosis (ICD10-I70.0). Electronically Signed   By: Darliss Cheney M.D.   On: 09/17/2023 18:55    Assessment/Plan:   Duodenitis on CT: likely related to her abdominal pain and recent N/V. Possibly nsaid related vs infectious/inflammatory.  -Recommend EGD for further evaluation. ASA 2.  I have discussed the risks, alternatives, benefits with regards to but not limited to the risk of reaction to medication, bleeding, infection, perforation and the patient is agreeable to proceed. Written consent to be obtained. -pantoprazole 40mg  daily before breakfast -dicyclomine 10mg  up to four times  daily prn, given #6 from ED, use sparingly due to risk of sedation and constipation -zofran prn  Constipation: -in setting of recent illness, diminished oral intake -miralax one capful daily prn  Anemia: -chronic, will check anemia labs -plan on EGD now, once feeling better can consider colonoscopy  Elevated creatinine: baseline unknown, previous values in setting of acute illness. Recheck labs next week   Pulmonary nodules -follow up imaging to be completed by PCP. I have provided facility with information about pulmonary nodules and requested they follow up with PCP. They have copy of CT report. Patient aware and agreeable     Leanna Battles. Melvyn Neth, MHS, PA-C [facility]

## 2023-09-19 NOTE — Patient Instructions (Addendum)
Start taking pantoprazole 40mg  daily before breakfast, we will want to you take this until your upper endoscopy. I will send in another RX with refills. Take dicyclomine 10mg  up to four times daily as needed for abdominal pain.  Take zofran 4mg  every 6 hours as needed for nausea/vomiting. For constipation, you can take miralax 17 grams daily as needed. RX sent to pharmacy. Update labs next week: CBC, iron/tibc/ferritin/b12/folate and bmet We will consider a colonoscopy after your upper endoscopy is completed. Please follow up with PCP for lung nodules. You will need to have additional imaging in about 3 months per radiology. See report provided today.

## 2023-09-20 ENCOUNTER — Telehealth: Payer: Self-pay | Admitting: *Deleted

## 2023-09-20 ENCOUNTER — Encounter: Payer: Self-pay | Admitting: *Deleted

## 2023-09-20 NOTE — Telephone Encounter (Signed)
[employer] PA: Approved Authorization #[id number]  Tracking [id number] Dates of service 10/24/2023 - 10/24/2023

## 2023-10-09 ENCOUNTER — Ambulatory Visit (INDEPENDENT_AMBULATORY_CARE_PROVIDER_SITE_OTHER): Payer: Medicare HMO | Admitting: Podiatry

## 2023-10-09 ENCOUNTER — Encounter: Payer: Self-pay | Admitting: Podiatry

## 2023-10-09 DIAGNOSIS — M79675 Pain in left toe(s): Secondary | ICD-10-CM | POA: Diagnosis not present

## 2023-10-09 DIAGNOSIS — M79674 Pain in right toe(s): Secondary | ICD-10-CM

## 2023-10-09 DIAGNOSIS — B351 Tinea unguium: Secondary | ICD-10-CM | POA: Diagnosis not present

## 2023-10-09 DIAGNOSIS — M2011 Hallux valgus (acquired), right foot: Secondary | ICD-10-CM

## 2023-10-09 DIAGNOSIS — R1010 Upper abdominal pain, unspecified: Secondary | ICD-10-CM | POA: Diagnosis not present

## 2023-10-09 DIAGNOSIS — M21619 Bunion of unspecified foot: Secondary | ICD-10-CM

## 2023-10-09 DIAGNOSIS — R933 Abnormal findings on diagnostic imaging of other parts of digestive tract: Secondary | ICD-10-CM | POA: Diagnosis not present

## 2023-10-09 NOTE — Progress Notes (Signed)
Subjective:  Patient ID: Ellen Mcmillan, female    DOB: 07-Dec-1945,  MRN: [id number]  Ellen Mcmillan presents to clinic today for:  Chief Complaint  Patient presents with   Foot Pain    She is here for [facility] today and nail trim. She is having some pain and thinks she may have a bunion on the right foot beside the big toe and is very painful.   Patient notes nails are thick, discolored, elongated and painful in shoegear when trying to ambulate.  A family member is with her today.  She notes her right bunion has been giving her significant pain.  She notes most of the pain was from pressure.  However, she also states that she will even have some pain without any shoes on.  She states that she feels most of the pain is right on the bump and not as much within the joint itself.  Denies injury denies significant swelling or redness to the area  PCP is Rush Farmer, NP.  Past Medical History:  Diagnosis Date   Diabetes mellitus without complication (HCC)    Hypertension    Ovarian cyst    Schizophrenia (HCC)     Past Surgical History:  Procedure Laterality Date   ABDOMINAL HYSTERECTOMY     FRACTURE SURGERY Right    ankle   TUBAL LIGATION     No Known Allergies  Review of Systems: Negative except as noted in the HPI.  Objective:  Ellen Mcmillan is a pleasant 77 y.o. female in NAD. AAO x 3.  Vascular Examination: Capillary refill time is 3-5 seconds to toes bilateral. Palpable pedal pulses b/l LE. Digital hair present b/l.  Skin temperature gradient WNL b/l. No varicosities b/l. No cyanosis noted b/l.   Dermatological Examination: Pedal skin with normal turgor, texture and tone b/l. No open wounds. No interdigital macerations b/l. Toenails x10 are 3mm thick, discolored, dystrophic with subungual debris. There is pain with compression of the nail plates.  They are elongated x10  Musculoskeletal Examination: There is lateral deviation of the left hallux with a bony dorsal  medial prominence of the first metatarsal phalangeal joint.  There is decreased first MPJ range of motion without crepitus.  The hallux is reducible.  There is pain on palpation to the dorsal medial "bump"  Assessment/Plan: 1. Pain due to onychomycosis of toenails of both feet   2. Bunion   3. Acquired hallux valgus of right foot    The mycotic toenails were sharply debrided x10 with sterile nail nippers and a power debriding burr to decrease bulk/thickness and length.    Discussed conservative options for treating the bunion pain.  This included application and massage of Voltaren gel twice daily into the first MPJ) site.  Also offered a cortisone injection which the patient refused at this time.  A double loop gel toe spacer was dispensed to keep the hallux in a better position.  Informed patient that if conservative measures are not successful in alleviating her symptoms she may need to proceed with surgical intervention.  Also discussed shoe accommodation.  Return in about 3 months (around 01/07/2024) for [facility].   Clerance Lav, DPM, FACFAS [facility]     [address]  [place], [place] [address]                Office [phone]  Fax [phone]

## 2023-10-10 LAB — CBC WITH DIFFERENTIAL/PLATELET
Basophils Absolute: 0.1 10*3/uL (ref 0.0–0.2)
Basos: 1 %
EOS (ABSOLUTE): 0.1 10*3/uL (ref 0.0–0.4)
Eos: 2 %
Hematocrit: 32.6 % — ABNORMAL LOW (ref 34.0–46.6)
Hemoglobin: 10.8 g/dL — ABNORMAL LOW (ref 11.1–15.9)
Immature Grans (Abs): 0 10*3/uL (ref 0.0–0.1)
Immature Granulocytes: 0 %
Lymphocytes Absolute: 3.4 10*3/uL — ABNORMAL HIGH (ref 0.7–3.1)
Lymphs: 47 %
MCH: 31.9 pg (ref 26.6–33.0)
MCHC: 33.1 g/dL (ref 31.5–35.7)
MCV: 96 fL (ref 79–97)
Monocytes Absolute: 0.8 10*3/uL (ref 0.1–0.9)
Monocytes: 10 %
Neutrophils Absolute: 2.9 10*3/uL (ref 1.4–7.0)
Neutrophils: 40 %
Platelets: 282 10*3/uL (ref 150–450)
RBC: 3.39 x10E6/uL — ABNORMAL LOW (ref 3.77–5.28)
RDW: 11.4 % — ABNORMAL LOW (ref 11.7–15.4)
WBC: 7.3 10*3/uL (ref 3.4–10.8)

## 2023-10-10 LAB — BASIC METABOLIC PANEL
BUN/Creatinine Ratio: 12 (ref 12–28)
BUN: 29 mg/dL — ABNORMAL HIGH (ref 8–27)
CO2: 17 mmol/L — ABNORMAL LOW (ref 20–29)
Calcium: 9.8 mg/dL (ref 8.7–10.3)
Chloride: 105 mmol/L (ref 96–106)
Creatinine, Ser: 2.41 mg/dL — ABNORMAL HIGH (ref 0.57–1.00)
Glucose: 97 mg/dL (ref 70–99)
Potassium: 5.2 mmol/L (ref 3.5–5.2)
Sodium: 137 mmol/L (ref 134–144)
eGFR: 20 mL/min/{1.73_m2} — ABNORMAL LOW (ref >59)

## 2023-10-10 LAB — IRON,TIBC AND FERRITIN PANEL
Ferritin: 398 ng/mL — ABNORMAL HIGH (ref 15–150)
Iron Saturation: 27 % (ref 15–55)
Iron: 70 ug/dL (ref 27–139)
Total Iron Binding Capacity: 263 ug/dL (ref 250–450)
UIBC: 193 ug/dL (ref 118–369)

## 2023-10-10 LAB — B12 AND FOLATE PANEL
Folate: >20 ng/mL (ref >3.0)
Vitamin B-12: 1026 pg/mL (ref 232–1245)

## 2023-10-24 ENCOUNTER — Ambulatory Visit (HOSPITAL_COMMUNITY): Payer: Medicare HMO | Admitting: Anesthesiology

## 2023-10-24 ENCOUNTER — Other Ambulatory Visit: Payer: Self-pay

## 2023-10-24 ENCOUNTER — Ambulatory Visit (HOSPITAL_COMMUNITY)
Admission: RE | Admit: 2023-10-24 | Discharge: 2023-10-24 | Disposition: A | Discharge status: Home / Self Care | Payer: Medicare HMO | Attending: Internal Medicine | Admitting: Internal Medicine

## 2023-10-24 ENCOUNTER — Encounter (HOSPITAL_COMMUNITY): Payer: Self-pay

## 2023-10-24 ENCOUNTER — Encounter (HOSPITAL_COMMUNITY)
Admission: RE | Disposition: A | Discharge status: Home / Self Care | Payer: Self-pay | Source: Home / Self Care | Attending: Internal Medicine

## 2023-10-24 DIAGNOSIS — D649 Anemia, unspecified: Secondary | ICD-10-CM | POA: Insufficient documentation

## 2023-10-24 DIAGNOSIS — K2970 Gastritis, unspecified, without bleeding: Secondary | ICD-10-CM | POA: Insufficient documentation

## 2023-10-24 DIAGNOSIS — R933 Abnormal findings on diagnostic imaging of other parts of digestive tract: Secondary | ICD-10-CM | POA: Insufficient documentation

## 2023-10-24 DIAGNOSIS — R1013 Epigastric pain: Secondary | ICD-10-CM | POA: Insufficient documentation

## 2023-10-24 DIAGNOSIS — E119 Type 2 diabetes mellitus without complications: Secondary | ICD-10-CM | POA: Insufficient documentation

## 2023-10-24 DIAGNOSIS — K269 Duodenal ulcer, unspecified as acute or chronic, without hemorrhage or perforation: Secondary | ICD-10-CM | POA: Diagnosis not present

## 2023-10-24 DIAGNOSIS — K3189 Other diseases of stomach and duodenum: Secondary | ICD-10-CM

## 2023-10-24 DIAGNOSIS — Z79899 Other long term (current) drug therapy: Secondary | ICD-10-CM | POA: Diagnosis not present

## 2023-10-24 DIAGNOSIS — I10 Essential (primary) hypertension: Secondary | ICD-10-CM | POA: Insufficient documentation

## 2023-10-24 DIAGNOSIS — K2980 Duodenitis without bleeding: Secondary | ICD-10-CM | POA: Diagnosis not present

## 2023-10-24 HISTORY — PX: BIOPSY: SHX5522

## 2023-10-24 HISTORY — PX: ESOPHAGOGASTRODUODENOSCOPY (EGD) WITH PROPOFOL: SHX5813

## 2023-10-24 SURGERY — ESOPHAGOGASTRODUODENOSCOPY (EGD) WITH PROPOFOL
Anesthesia: General

## 2023-10-24 MED ORDER — LACTATED RINGERS IV SOLN: INTRAVENOUS | Status: DC

## 2023-10-24 MED ORDER — PANTOPRAZOLE SODIUM 40 MG PO TBEC: 40.0000 mg | DELAYED_RELEASE_TABLET | Freq: Two times a day (BID) | ORAL | 11 refills | Status: DC

## 2023-10-24 MED ORDER — LIDOCAINE HCL (PF) 2 % IJ SOLN
INTRAMUSCULAR | Status: DC | PRN
  Administered 2023-10-24: 50 mg via INTRADERMAL

## 2023-10-24 MED ORDER — PROPOFOL 10 MG/ML IV BOLUS
INTRAVENOUS | Status: DC | PRN
  Administered 2023-10-24: 30 mg via INTRAVENOUS
  Administered 2023-10-24: 100 mg via INTRAVENOUS

## 2023-10-24 MED ORDER — PROPOFOL 500 MG/50ML IV EMUL
INTRAVENOUS | Status: DC | PRN
  Administered 2023-10-24: 150 ug/kg/min via INTRAVENOUS

## 2023-10-24 NOTE — Discharge Instructions (Addendum)
 EGD Discharge instructions Please read the instructions outlined below and refer to this sheet in the next few weeks. These discharge instructions provide you with general information on caring for yourself after you leave the hospital. Your doctor may also give you specific instructions. While your treatment has been planned according to the most current medical practices available, unavoidable complications occasionally occur. If you have any problems or questions after discharge, please call your doctor. ACTIVITY You may resume your regular activity but move at a slower pace for the next 24 hours.  Take frequent rest periods for the next 24 hours.  Walking will help expel (get rid of) the air and reduce the bloated feeling in your abdomen.  No driving for 24 hours (because of the anesthesia (medicine) used during the test).  You may shower.  Do not sign any important legal documents or operate any machinery for 24 hours (because of the anesthesia used during the test).  NUTRITION Drink plenty of fluids.  You may resume your normal diet.  Begin with a light meal and progress to your normal diet.  Avoid alcoholic beverages for 24 hours or as instructed by your caregiver.  MEDICATIONS You may resume your normal medications unless your caregiver tells you otherwise.  WHAT YOU CAN EXPECT TODAY You may experience abdominal discomfort such as a feeling of fullness or "gas" pains.  FOLLOW-UP Your doctor will discuss the results of your test with you.  SEEK IMMEDIATE MEDICAL ATTENTION IF ANY OF THE FOLLOWING OCCUR: Excessive nausea (feeling sick to your stomach) and/or vomiting.  Severe abdominal pain and distention (swelling).  Trouble swallowing.  Temperature over 101 F (37.8 C).  Rectal bleeding or vomiting of blood.   Your EGD revealed mild amount inflammation in your stomach.  I took biopsies of this to rule out infection with a bacteria called H. pylori.  You also have a medium sized  ulcer in your small bowel.  I took samples of this today as well.  Esophagus appeared normal.  Await pathology results, my office will contact you.  I am going to increase your pantoprazole  to twice daily for the next 12 weeks.  Limit NSAID use as best as you can.  Follow-up in GI office in 6 to 8 weeks.  I hope you have a great rest of your week!  Carlin POUR. Cindie, D.O. [facility]

## 2023-10-24 NOTE — Anesthesia Procedure Notes (Signed)
 Date/Time: 10/24/2023 1:18 PM  Performed by: Eliodoro Deward FALCON, CRNAPre-anesthesia Checklist: Patient identified, Emergency Drugs available, Suction available and Patient being monitored Patient Re-evaluated:Patient Re-evaluated prior to induction Oxygen Delivery Method: Nasal cannula Induction Type: IV induction Placement Confirmation: positive ETCO2

## 2023-10-24 NOTE — H&P (Signed)
 Primary Care Physician:  Gammon, Chrystal, NP Primary Gastroenterologist:  Dr. Cindie  Pre-Procedure History & Physical: HPI:  Ellen Mcmillan is a 77 y.o. female is here  for an EGD to be performed for abdominal pain, duodenitis, abnormal CT.   Past Medical History:  Diagnosis Date   Diabetes mellitus without complication (HCC)    Hypertension    Ovarian cyst    Schizophrenia (HCC)     Past Surgical History:  Procedure Laterality Date   ABDOMINAL HYSTERECTOMY     FRACTURE SURGERY Right    ankle   TUBAL LIGATION      Prior to Admission medications   Medication Sig Start Date End Date Taking? Authorizing Provider  amLODipine  (NORVASC ) 5 MG tablet TAKE (1) TABLET BY MOUTH ONCE DAILY. Patient taking differently: Take 5 mg by mouth daily. 06/29/22  Yes Hilty, Vinie BROCKS, MD  carvedilol  (COREG ) 12.5 MG tablet TAKE (1) TABLET BY MOUTH TWICE DAILY. Patient taking differently: Take 12.5 mg by mouth 2 (two) times daily with a meal. 06/29/22  Yes Hilty, Vinie BROCKS, MD  cetirizine (ZYRTEC) 10 MG tablet Take by mouth. 07/25/22  Yes [provider]  dicyclomine  (BENTYL ) 10 MG capsule Take 1 capsule (10 mg total) by mouth 4 (four) times daily -  before meals and at bedtime. 09/17/23  Yes Beatty, Celeste A, PA-C  fluticasone (FLONASE) 50 MCG/ACT nasal spray Place into both nostrils. 07/01/22  Yes [provider]  losartan -hydrochlorothiazide (HYZAAR) 100-25 MG tablet Take 1 tablet by mouth daily.   Yes [provider]  Multiple Vitamin (MULTIVITAMIN) tablet Take 1 tablet by mouth daily.   Yes [provider]  pantoprazole  (PROTONIX ) 40 MG tablet Take 1 tablet (40 mg total) by mouth daily before breakfast. 09/19/23  Yes Ezzard Sonny RAMAN, PA-C  simvastatin  (ZOCOR ) 20 MG tablet Take 20 mg by mouth daily.   Yes [provider]  spironolactone  (ALDACTONE ) 25 MG tablet Take 25 mg by mouth 2 (two) times daily. 10/18/18  Yes [provider]  ziprasidone   (GEODON ) 60 MG capsule Take 60 mg by mouth in the morning. 60mg  in am, 80mg  at bedtime   Yes [provider]  acetaminophen  (TYLENOL ) 325 MG tablet Take 650 mg by mouth every 6 (six) hours as needed for mild pain.    [provider]  ondansetron  (ZOFRAN -ODT) 4 MG disintegrating tablet Take 1 tablet (4 mg total) by mouth every 6 (six) hours as needed for nausea or vomiting. 09/17/23   Suellen Cantor A, PA-C  polyethylene glycol powder (GLYCOLAX /MIRALAX ) 17 GM/SCOOP powder Take one capful daily as needed. 09/19/23   Ezzard Sonny RAMAN, PA-C    Allergies as of 09/20/2023   (No Known Allergies)    Family History  Problem Relation Age of Onset   Kidney failure Mother 53   Stroke Mother    Cancer Father        lung   Mental illness Sister    Mental illness Other    Cancer Other    Stroke Other     Social History   Socioeconomic History   Marital status: Divorced    Spouse name: Not on file   Number of children: Not on file   Years of education: Not on file   Highest education level: Not on file  Occupational History   Not on file  Tobacco Use   Smoking status: Never   Smokeless tobacco: Never  Vaping Use   Vaping status: Never Used  Substance and  Sexual Activity   Alcohol  use: No   Drug use: No   Sexual activity: Not on file  Other Topics Concern   Not on file  Social History Narrative   Not on file   Social Drivers of Health   Financial Resource Strain: Not on file  Food Insecurity: Not on file  Transportation Needs: Not on file  Physical Activity: Not on file  Stress: Not on file  Social Connections: Not on file  Intimate Partner Violence: Not on file    Review of Systems: General: Negative for fever, chills, fatigue, weakness. Eyes: Negative for vision changes.  ENT: Negative for hoarseness, difficulty swallowing , nasal congestion. CV: Negative for chest pain, angina, palpitations, dyspnea on exertion, peripheral edema.  Respiratory:  Negative for dyspnea at rest, dyspnea on exertion, cough, sputum, wheezing.  GI: See history of present illness. GU:  Negative for dysuria, hematuria, urinary incontinence, urinary frequency, nocturnal urination.  MS: Negative for joint pain, low back pain.  Derm: Negative for rash or itching.  Neuro: Negative for weakness, abnormal sensation, seizure, frequent headaches, memory loss, confusion.  Psych: Negative for anxiety, depression Endo: Negative for unusual weight change.  Heme: Negative for bruising or bleeding. Allergy: Negative for rash or hives.  Physical Exam: Vital signs in last 24 hours: Temp:  [98.5 F (36.9 C)] 98.5 F (36.9 C) (12/31 1243) Pulse Rate:  [80] 80 (12/31 1243) Resp:  [20] 20 (12/31 1243) BP: (141)/(49) 141/49 (12/31 1243) SpO2:  [96 %] 96 % (12/31 1243) Weight:  [81.6 kg] 81.6 kg (12/31 1243)   General:   Alert,  Well-developed, well-nourished, pleasant and cooperative in NAD Head:  Normocephalic and atraumatic. Eyes:  Sclera clear, no icterus.   Conjunctiva pink. Ears:  Normal auditory acuity. Nose:  No deformity, discharge,  or lesions. Msk:  Symmetrical without gross deformities. Normal posture. Extremities:  Without clubbing or edema. Neurologic:  Alert and  oriented x4;  grossly normal neurologically. Skin:  Intact without significant lesions or rashes. Psych:  Alert and cooperative. Normal mood and affect.   Impression/Plan: Ellen Mcmillan is here for an EGD to be performed for abdominal pain, duodenitis, abnormal CT.   Risks, benefits, limitations, imponderables and alternatives regarding procedure have been reviewed with the patient. Questions have been answered. All parties agreeable.

## 2023-10-24 NOTE — Anesthesia Preprocedure Evaluation (Signed)
 Anesthesia Evaluation  Patient identified by MRN, date of birth, ID band Patient awake    Reviewed: Allergy & Precautions, H&P , NPO status , Patient's Chart, lab work & pertinent test results, reviewed documented beta blocker date and time   Airway Mallampati: II  TM Distance: >3 FB Neck ROM: full    Dental no notable dental hx.    Pulmonary neg pulmonary ROS   Pulmonary exam normal breath sounds clear to auscultation       Cardiovascular Exercise Tolerance: Good hypertension, negative cardio ROS  Rhythm:regular Rate:Normal     Neuro/Psych  PSYCHIATRIC DISORDERS    Schizophrenia  negative neurological ROS  negative psych ROS   GI/Hepatic negative GI ROS, Neg liver ROS,,,  Endo/Other  negative endocrine ROSdiabetes    Renal/GU negative Renal ROS  negative genitourinary   Musculoskeletal   Abdominal   Peds  Hematology negative hematology ROS (+)   Anesthesia Other Findings   Reproductive/Obstetrics negative OB ROS                             Anesthesia Physical Anesthesia Plan  ASA: 2  Anesthesia Plan: General   Post-op Pain Management:    Induction:   PONV Risk Score and Plan: Propofol  infusion  Airway Management Planned:   Additional Equipment:   Intra-op Plan:   Post-operative Plan:   Informed Consent: I have reviewed the patients History and Physical, chart, labs and discussed the procedure including the risks, benefits and alternatives for the proposed anesthesia with the patient or authorized representative who has indicated his/her understanding and acceptance.     Dental Advisory Given  Plan Discussed with: CRNA  Anesthesia Plan Comments:        Anesthesia Quick Evaluation

## 2023-10-24 NOTE — Transfer of Care (Signed)
 Immediate Anesthesia Transfer of Care Note  Patient: Ellen Mcmillan  Procedure(s) Performed: ESOPHAGOGASTRODUODENOSCOPY (EGD) WITH PROPOFOL  BIOPSY  Patient Location: Endoscopy Unit  Anesthesia Type:General  Level of Consciousness: drowsy  Airway & Oxygen Therapy: Patient Spontanous Breathing  Post-op Assessment: Report given to RN and Post -op Vital signs reviewed and stable  Post vital signs: Reviewed and stable  Last Vitals:  Vitals Value Taken Time  BP    Temp    Pulse    Resp    SpO2      Last Pain:  Vitals:   10/24/23 1319  TempSrc:   PainSc: 0-No pain      Patients Stated Pain Goal: 7 (10/24/23 1243)  Complications: No notable events documented.

## 2023-10-24 NOTE — Op Note (Signed)
 [facility] Patient Name: Ellen Mcmillan Procedure Date: 10/24/2023 1:09 PM MRN: [id number] Date of Birth: [birth year] Attending MD: Carlin POUR. Cindie , [place], [id number] CSN: [id number] Age: 77 Admit Type: Outpatient Procedure:                Upper GI endoscopy Indications:              Epigastric abdominal pain, Abnormal CT of the GI                            tract, Suspected duodenitis Providers:                Carlin POUR. Cindie, DO, Crystal Page, Bascom Blush Referring MD:              Medicines:                See the Anesthesia note for documentation of the                            administered medications Complications:            No immediate complications. Estimated Blood Loss:     Estimated blood loss was minimal. Procedure:                Pre-Anesthesia Assessment:                           - The anesthesia plan was to use monitored                            anesthesia care (MAC).                           After obtaining informed consent, the endoscope was                            passed under direct vision. Throughout the                            procedure, the patient's blood pressure, pulse, and                            oxygen saturations were monitored continuously. The                            [id number]-H190 ([id number]) scope was introduced through the                            mouth, and advanced to the second part of duodenum.                            The upper GI endoscopy was accomplished without                            difficulty. The patient tolerated the procedure  well. Scope In: 1:24:57 PM Scope Out: 1:28:34 PM Total Procedure Duration: 0 hours 3 minutes 37 seconds  Findings:      The examined esophagus was normal.      Localized mild inflammation characterized by erosions and erythema was       found in the gastric antrum. Biopsies were taken with a cold forceps for       Helicobacter pylori testing.      One  non-bleeding cratered duodenal ulcer with no stigmata of bleeding       was found in the duodenal sweep. The lesion was 6-8 mm in largest       dimension. Biopsies were taken with a cold forceps for histology. Impression:               - Normal esophagus.                           - Gastritis. Biopsied.                           - Non-bleeding duodenal ulcer with no stigmata of                            bleeding. Biopsied. Moderate Sedation:      Per Anesthesia Care Recommendation:           - Patient has a contact number available for                            emergencies. The signs and symptoms of potential                            delayed complications were discussed with the                            patient. Return to normal activities tomorrow.                            Written discharge instructions were provided to the                            patient.                           - Resume previous diet.                           - Continue present medications.                           - Await pathology results.                           - Use Protonix  (pantoprazole ) 40 mg PO BID for 12                            weeks.                           -  No ibuprofen, naproxen, or other non-steroidal                            anti-inflammatory drugs.                           - Return to GI clinic in 8 weeks. Procedure Code(s):        --- Professional ---                           [phone], Esophagogastroduodenoscopy, flexible,                            transoral; with biopsy, single or multiple Diagnosis Code(s):        --- Professional ---                           K29.70, Gastritis, unspecified, without bleeding                           K26.9, Duodenal ulcer, unspecified as acute or                            chronic, without hemorrhage or perforation                           R10.13, Epigastric pain                           R93.3, Abnormal findings on diagnostic imaging of                             other parts of digestive tract CPT copyright 2022 [employer]. All rights reserved. The codes documented in this report are preliminary and upon coder review may  be revised to meet current compliance requirements. Carlin POUR. Cindie, DO Carlin POUR. Cindie, DO 10/24/2023 1:34:40 PM This report has been signed electronically. Number of Addenda: 0

## 2023-10-26 LAB — SURGICAL PATHOLOGY

## 2023-11-03 ENCOUNTER — Encounter (HOSPITAL_COMMUNITY): Payer: Self-pay | Admitting: Internal Medicine

## 2023-11-03 NOTE — Anesthesia Postprocedure Evaluation (Signed)
 Anesthesia Post Note  Patient: Ellen Mcmillan  Procedure(s) Performed: ESOPHAGOGASTRODUODENOSCOPY (EGD) WITH PROPOFOL  BIOPSY  Patient location during evaluation: Phase II Anesthesia Type: General Level of consciousness: awake Pain management: pain level controlled Vital Signs Assessment: post-procedure vital signs reviewed and stable Respiratory status: spontaneous breathing and respiratory function stable Cardiovascular status: blood pressure returned to baseline and stable Postop Assessment: no headache and no apparent nausea or vomiting Anesthetic complications: no Comments: Late entry   No notable events documented.   Last Vitals:  Vitals:   10/24/23 1334 10/24/23 1337  BP: (!) 106/41 (!) 116/44  Pulse: 69 71  Resp: (!) 22 20  Temp: 36.4 C   SpO2: 95% 98%    Last Pain:  Vitals:   10/24/23 1337  TempSrc:   PainSc: 0-No pain                 Yvonna JINNY Bosworth

## 2023-12-04 NOTE — Progress Notes (Signed)
GI Office Note    Referring Provider: Rush Farmer, NP Primary Care Physician:  Rush Farmer, NP  Primary Gastroenterologist: Hennie Duos. Marletta Lor, DO   Chief Complaint   Chief Complaint  Patient presents with   Follow-up    Follow up after EGD    History of Present Illness   Ellen Mcmillan is a 78 y.o. female presenting today for follow up. Last seen 08/2023. Seen at that time for abodminal pain.   CT abdomen pelvis without contrast September 17, 2023 showed circumferential wall thickening and inflammation of the duodenum consistent with duodenitis.  Multiple pulmonary nodules seen.  Right renal atrophy.   Today: abdominal pain resolves. Appetite returned to normal. No heartburn, n/v. BMs now regular. No melena, brbpr. She did not bring medications list, from a group home. We have requested current medication list but not available at time of office visit.   Patient does not want to consider a colonoscopy, due to her age.   She is mostly concerned about 7 days upper respiratory illness. Denies ill contacts although her driver is sick today. She denies other residents in her home being ill. She complains of sinus drainage, sore throat, cough. Yellow mucous from her nose and when coughing. No fever. No SOB. Using OTC medications like mucinex and emergen-C. Some improvement.  Patient is concerned that she will not get her Chest CT done if we don't arrange it.   EGD 09/2023: -normal esophagus -gastritis s/p biopsy neg for H.pylori -nonbleeding duodenal ulcer with no stigmata of bleeding s/p bx, c/w peptic injury  Colonoscopy 2007: -normal exam -return in 10 years  Medications   Current Outpatient Medications  Medication Sig Dispense Refill   acetaminophen (TYLENOL) 325 MG tablet Take 650 mg by mouth every 6 (six) hours as needed for mild pain.     amLODipine (NORVASC) 5 MG tablet TAKE (1) TABLET BY MOUTH ONCE DAILY. (Patient taking differently: Take 5 mg by mouth  daily.) 30 tablet 10   carvedilol (COREG) 12.5 MG tablet TAKE (1) TABLET BY MOUTH TWICE DAILY. (Patient taking differently: Take 12.5 mg by mouth 2 (two) times daily with a meal.) 60 tablet 10   cetirizine (ZYRTEC) 10 MG tablet Take by mouth.     fluticasone (FLONASE) 50 MCG/ACT nasal spray Place into both nostrils.     losartan-hydrochlorothiazide (HYZAAR) 100-25 MG tablet Take 1 tablet by mouth daily.     Multiple Vitamin (MULTIVITAMIN) tablet Take 1 tablet by mouth daily.     pantoprazole (PROTONIX) 40 MG tablet Take 1 tablet (40 mg total) by mouth 2 (two) times daily. 60 tablet 11   polyethylene glycol powder (GLYCOLAX/MIRALAX) 17 GM/SCOOP powder Take one capful daily as needed. 527 g 5   simvastatin (ZOCOR) 20 MG tablet Take 20 mg by mouth daily.     spironolactone (ALDACTONE) 25 MG tablet Take 25 mg by mouth 2 (two) times daily.     ziprasidone (GEODON) 60 MG capsule Take 60 mg by mouth in the morning. 60mg  in am, 80mg  at bedtime     No current facility-administered medications for this visit.    Allergies   Allergies as of 12/05/2023   (No Known Allergies)     Review of Systems   General: Negative for anorexia, weight loss, fever, chills, fatigue, weakness. ENT: Negative for hoarseness, difficulty swallowing , +nasal congestion. CV: Negative for chest pain, angina, palpitations, dyspnea on exertion, peripheral edema.  Respiratory: Negative for dyspnea at rest, dyspnea on exertion, +  cough, +sputum,no wheezing.  GI: See history of present illness. GU:  Negative for dysuria, hematuria, urinary incontinence, urinary frequency, nocturnal urination.  Endo: Negative for unusual weight change.     Physical Exam   BP (!) 138/56   Pulse 77   Temp 98.6 F (37 C)   Ht 5\' 4"  (1.626 m)   Wt 176 lb 6.4 oz (80 kg)   BMI 30.28 kg/m    General: Well-nourished, well-developed in no acute distress.  Eyes: No icterus. Mouth: Oropharyngeal mucosa moist and pink , no lesions erythema or  exudate. No tenderness with palpation of sinuses Lungs: Clear to auscultation bilaterally.  Heart: Regular rate and rhythm, no murmurs rubs or gallops.  Abdomen: Bowel sounds are normal, nontender, nondistended, no hepatosplenomegaly or masses,  no abdominal bruits or hernia , no rebound or guarding.  Rectal: not performed  Extremities: No lower extremity edema. No clubbing or deformities. Neuro: Alert and oriented x 4   Skin: Warm and dry, no jaundice.   Psych: Alert and cooperative, normal mood and affect.  Labs   Lab Results  Component Value Date   VITAMINB12 1,026 10/09/2023   Lab Results  Component Value Date   FOLATE >20.0 10/09/2023   Lab Results  Component Value Date   IRON 70 10/09/2023   TIBC 263 10/09/2023   FERRITIN 398 (H) 10/09/2023   Lab Results  Component Value Date   NA 137 10/09/2023   CL 105 10/09/2023   K 5.2 10/09/2023   CO2 17 (L) 10/09/2023   BUN 29 (H) 10/09/2023   CREATININE 2.41 (H) 10/09/2023   EGFR 20 (L) 10/09/2023   CALCIUM 9.8 10/09/2023   ALBUMIN 3.7 09/17/2023   GLUCOSE 97 10/09/2023   Lab Results  Component Value Date   WBC 7.3 10/09/2023   HGB 10.8 (L) 10/09/2023   HCT 32.6 (L) 10/09/2023   MCV 96 10/09/2023   PLT 282 10/09/2023   Lab Results  Component Value Date   LIPASE 53 (H) 09/17/2023   No results found for: "GLUF" Lab Results  Component Value Date   ALT 21 09/17/2023   AST 22 09/17/2023   ALKPHOS 82 09/17/2023   BILITOT 0.9 09/17/2023    Imaging Studies   No results found.  Assessment/Plan:   Duodenitis on CT/duodenal ulcer by EGD: -abdominal pain resolved -check updated medication list, to verify on PPI.  Constipation: -resolved  Anemia: -has been stable, likely anemia of chronic disease with CKD -no B12, folate, or IDA  Pulmonary nodules: -noncontrast Chest CT end of 12/2023  URI: -symptoms for 7 days, some improvement, likely viral but possible superimposed bacterial compoenent at this poin.   -consider antibiotic once med list provided    Leanna Battles. Melvyn Neth, MHS, PA-C [facility]

## 2023-12-05 ENCOUNTER — Encounter: Payer: Self-pay | Admitting: Gastroenterology

## 2023-12-05 ENCOUNTER — Ambulatory Visit (INDEPENDENT_AMBULATORY_CARE_PROVIDER_SITE_OTHER): Payer: 59 | Admitting: Gastroenterology

## 2023-12-05 VITALS — BP 138/56 | HR 77 | Temp 98.6°F | Ht 64.0 in | Wt 176.4 lb

## 2023-12-05 DIAGNOSIS — R051 Acute cough: Secondary | ICD-10-CM

## 2023-12-05 DIAGNOSIS — D649 Anemia, unspecified: Secondary | ICD-10-CM

## 2023-12-05 DIAGNOSIS — J069 Acute upper respiratory infection, unspecified: Secondary | ICD-10-CM | POA: Diagnosis not present

## 2023-12-05 DIAGNOSIS — R059 Cough, unspecified: Secondary | ICD-10-CM | POA: Insufficient documentation

## 2023-12-05 DIAGNOSIS — R918 Other nonspecific abnormal finding of lung field: Secondary | ICD-10-CM | POA: Diagnosis not present

## 2023-12-05 DIAGNOSIS — K269 Duodenal ulcer, unspecified as acute or chronic, without hemorrhage or perforation: Secondary | ICD-10-CM | POA: Diagnosis not present

## 2023-12-05 DIAGNOSIS — K5900 Constipation, unspecified: Secondary | ICD-10-CM | POA: Insufficient documentation

## 2023-12-05 NOTE — Patient Instructions (Signed)
Please provide updated list of medications so that we can make any necessary adjustments. We will reach out and schedule Chest CT at end of March to follow up on the pulmonary nodules.  We will consider adding antibiotic for cough/congestion once we see your current medication list.

## 2023-12-06 ENCOUNTER — Other Ambulatory Visit: Payer: Self-pay | Admitting: *Deleted

## 2023-12-06 ENCOUNTER — Telehealth: Payer: Self-pay | Admitting: *Deleted

## 2023-12-06 DIAGNOSIS — R918 Other nonspecific abnormal finding of lung field: Secondary | ICD-10-CM

## 2023-12-06 NOTE — Telephone Encounter (Signed)
Spoke with Roddie Mc Clinical research associate at [facility]) and advised of CT CHEST appt 3/28, arrival 12pm. She voiced understanding

## 2023-12-19 ENCOUNTER — Telehealth: Payer: Self-pay | Admitting: Gastroenterology

## 2023-12-19 NOTE — Telephone Encounter (Signed)
 Patient came into the office just really saying a lot and I wasn't sure what she was wanting.  Best I can make out is she has 2 spots on her lung and she wants to ask about that.  She did ask if she had an appointment with Verlon Au and I told her she didn't.  She also wanted to know are you supposed to be lifting if you have an ulcer.

## 2023-12-19 NOTE — Telephone Encounter (Signed)
 Unable to reach pt, number on file is not correct number.

## 2023-12-20 NOTE — Telephone Encounter (Signed)
 Spoke to Ellen Mcmillan and advised her of CT appt on 01/19/24 @ 1:15. Ellen Mcmillan verbalized understanding.

## 2024-01-19 ENCOUNTER — Ambulatory Visit (HOSPITAL_COMMUNITY): Payer: 59

## 2024-01-23 ENCOUNTER — Encounter (HOSPITAL_COMMUNITY): Payer: Self-pay

## 2024-01-23 ENCOUNTER — Ambulatory Visit (HOSPITAL_COMMUNITY)
Admission: RE | Admit: 2024-01-23 | Discharge: 2024-01-23 | Disposition: A | Discharge status: Home / Self Care | Source: Ambulatory Visit | Attending: Gastroenterology | Admitting: Gastroenterology

## 2024-01-23 DIAGNOSIS — R918 Other nonspecific abnormal finding of lung field: Secondary | ICD-10-CM | POA: Diagnosis present

## 2024-02-13 ENCOUNTER — Ambulatory Visit (INDEPENDENT_AMBULATORY_CARE_PROVIDER_SITE_OTHER): Payer: Medicare HMO | Admitting: Podiatry

## 2024-02-13 DIAGNOSIS — M79675 Pain in left toe(s): Secondary | ICD-10-CM | POA: Diagnosis not present

## 2024-02-13 DIAGNOSIS — B351 Tinea unguium: Secondary | ICD-10-CM

## 2024-02-13 DIAGNOSIS — M25571 Pain in right ankle and joints of right foot: Secondary | ICD-10-CM

## 2024-02-13 DIAGNOSIS — M79674 Pain in right toe(s): Secondary | ICD-10-CM | POA: Diagnosis not present

## 2024-02-13 MED ORDER — TRIAMCINOLONE ACETONIDE 10 MG/ML IJ SUSP
10.0000 mg | Freq: Once | INTRAMUSCULAR | Status: AC
  Administered 2024-02-13: 10 mg

## 2024-02-13 NOTE — Progress Notes (Signed)
       Subjective:  Patient ID: Ellen Mcmillan, female    DOB: [birth year],  MRN: [id number]  Ellen Mcmillan presents to clinic today for:  Chief Complaint  Patient presents with   Diabetes    Patient is here for routine foot care and right foot painful bunion   Patient notes nails are thick, discolored, elongated and painful in shoegear when trying to ambulate.  She noted she doesn't want any length trimmed from the hallux toenails, but does want them sanded thinner.    Also noted she is still having pain in her right bunion.  The Voltaren gel hasn't improved her symptoms.  She is open to an injection today.    PCP is Natha Bair, NP.  Past Medical History:  Diagnosis Date   Diabetes mellitus without complication (HCC)    Hypertension    Ovarian cyst    Schizophrenia [facility])     Past Surgical History:  Procedure Laterality Date   ABDOMINAL HYSTERECTOMY     BIOPSY  10/24/2023   Procedure: BIOPSY;  Surgeon: Vinetta Greening, DO;  [facility];  Service: Endoscopy;;   ESOPHAGOGASTRODUODENOSCOPY (EGD) WITH PROPOFOL  N/A 10/24/2023   Procedure: ESOPHAGOGASTRODUODENOSCOPY (EGD) WITH PROPOFOL ;  Surgeon: Vinetta Greening, DO;  [facility];  Service: Endoscopy;  Laterality: N/A;  1:45 pm, asa 2   FRACTURE SURGERY Right    ankle   TUBAL LIGATION      No Known Allergies  Review of Systems: Negative except as noted in the HPI.  Objective:  Ellen Mcmillan is a pleasant 79 y.o. female in NAD. AAO x 3.  Vascular Examination: Capillary refill time is 3-5 seconds to toes bilateral. Palpable pedal pulses b/l LE. Digital hair present b/l.  Skin temperature gradient WNL b/l. No varicosities b/l. No cyanosis noted b/l.   Dermatological Examination: Pedal skin with normal turgor, texture and tone b/l. No open wounds. No interdigital macerations b/l. Toenails x10 are 3mm thick, discolored, dystrophic with subungual debris. There is pain with compression of the nail  plates.  They are elongated x10  Musculoskeletal Examination: Pain on palpation right bunion and along medial 1st MPJ.  No erythema.  Assessment/Plan: 1. Pain in joint of right foot   2. Pain due to onychomycosis of toenails of both feet     Meds ordered this encounter  Medications   triamcinolone  acetonide (KENALOG ) 10 MG/ML injection 10 mg   The mycotic toenails were sharply debrided x10 with sterile nail nippers and a power debriding burr to decrease bulk/thickness and length.    With the patient's verbal consent, a corticosteroid injection was adminstered to the right 1st MPJ.  This consisted of a mixture of 1% lidocaine  plain, 0.5% sensorcaine plain, and Kenalog -10 for a total of 1.25cc's administered.  Bandaid applied. Patient tolerated this well.     Return in about 3 months (around 05/14/2024) for [facility].   Joe Murders, DPM, FACFAS [facility]     [address] [place], [place] [address]                Office [phone]  Fax [phone]

## 2024-03-25 ENCOUNTER — Ambulatory Visit: Payer: Self-pay

## 2024-03-25 VITALS — BP 133/70 | HR 79 | Ht 64.0 in | Wt 180.0 lb

## 2024-03-25 DIAGNOSIS — E1159 Type 2 diabetes mellitus with other circulatory complications: Secondary | ICD-10-CM | POA: Diagnosis not present

## 2024-03-25 DIAGNOSIS — E1169 Type 2 diabetes mellitus with other specified complication: Secondary | ICD-10-CM

## 2024-03-25 DIAGNOSIS — F209 Schizophrenia, unspecified: Secondary | ICD-10-CM | POA: Diagnosis not present

## 2024-03-25 DIAGNOSIS — I152 Hypertension secondary to endocrine disorders: Secondary | ICD-10-CM

## 2024-03-25 DIAGNOSIS — E785 Hyperlipidemia, unspecified: Secondary | ICD-10-CM

## 2024-03-25 DIAGNOSIS — K219 Gastro-esophageal reflux disease without esophagitis: Secondary | ICD-10-CM

## 2024-03-25 MED ORDER — ZIPRASIDONE HCL 80 MG PO CAPS: 80.0000 mg | ORAL_CAPSULE | Freq: Two times a day (BID) | ORAL | 2 refills | Status: DC

## 2024-03-25 MED ORDER — LOSARTAN POTASSIUM-HCTZ 100-25 MG PO TABS: 1.0000 | ORAL_TABLET | Freq: Every day | ORAL | 3 refills | Status: DC

## 2024-03-25 MED ORDER — SIMVASTATIN 20 MG PO TABS: 20.0000 mg | ORAL_TABLET | Freq: Every day | ORAL | 11 refills | Status: AC

## 2024-03-25 MED ORDER — PANTOPRAZOLE SODIUM 40 MG PO TBEC
40.0000 mg | DELAYED_RELEASE_TABLET | Freq: Two times a day (BID) | ORAL | 11 refills | Status: DC
Start: 2024-03-25 — End: 2024-05-15

## 2024-03-25 MED ORDER — AMLODIPINE BESYLATE 10 MG PO TABS: 10.0000 mg | ORAL_TABLET | Freq: Every day | ORAL | 5 refills | Status: DC

## 2024-03-25 MED ORDER — SPIRONOLACTONE 25 MG PO TABS: 25.0000 mg | ORAL_TABLET | Freq: Two times a day (BID) | ORAL | 5 refills | Status: DC

## 2024-03-25 MED ORDER — CARVEDILOL 12.5 MG PO TABS: 12.5000 mg | ORAL_TABLET | Freq: Two times a day (BID) | ORAL | 5 refills | Status: DC

## 2024-03-25 NOTE — Patient Instructions (Addendum)
 Follow up in 3 months

## 2024-03-25 NOTE — Progress Notes (Signed)
 New Patient Office Visit  Subjective    Patient ID: Ellen Mcmillan, female    DOB: October 12, 1946  Age: 78 y.o. MRN: [id number]  CC:  Chief Complaint  Patient presents with   Establish Care    Pt here to establish care    HPI Ellen Mcmillan presents to establish care Patient is here with her.  Her support representative, Ellen Mcmillan.  Patient has been living in a group home for the past 3 years, and is now moving into her own home with the help of [facility] housing program.  She needs forms filled out for Social Security to release her fines to her instead of the group home.  [facility] has a support team that includes a therapist and peers support to assist her with the transition of living on her own.  She also needs refills on her medications since these were previously filled through group home pharmacy.  She denies any complaints or concerns at this time.  Outpatient Encounter Medications as of 03/25/2024  Medication Sig   cetirizine (ZYRTEC) 10 MG tablet Take by mouth.   Multiple Vitamin (MULTIVITAMIN) tablet Take 1 tablet by mouth daily.   [DISCONTINUED] amLODipine  (NORVASC ) 5 MG tablet TAKE (1) TABLET BY MOUTH ONCE DAILY. (Patient taking differently: Take 10 mg by mouth daily.)   [DISCONTINUED] carvedilol  (COREG ) 12.5 MG tablet TAKE (1) TABLET BY MOUTH TWICE DAILY. (Patient taking differently: Take 12.5 mg by mouth 2 (two) times daily with a meal.)   [DISCONTINUED] losartan-hydrochlorothiazide (HYZAAR) 100-25 MG tablet Take 1 tablet by mouth daily.   [DISCONTINUED] pantoprazole  (PROTONIX ) 40 MG tablet Take 1 tablet (40 mg total) by mouth 2 (two) times daily.   [DISCONTINUED] simvastatin (ZOCOR) 20 MG tablet Take 20 mg by mouth daily.   [DISCONTINUED] spironolactone (ALDACTONE) 25 MG tablet Take 25 mg by mouth 2 (two) times daily.   [DISCONTINUED] ziprasidone (GEODON) 60 MG capsule Take 60 mg by mouth in the morning. 60mg  in am, 80mg  at bedtime   amLODipine  (NORVASC ) 10 MG tablet Take 1 tablet (10 mg  total) by mouth daily.   carvedilol  (COREG ) 12.5 MG tablet Take 1 tablet (12.5 mg total) by mouth 2 (two) times daily with a meal.   losartan-hydrochlorothiazide (HYZAAR) 100-25 MG tablet Take 1 tablet by mouth daily.   pantoprazole  (PROTONIX ) 40 MG tablet Take 1 tablet (40 mg total) by mouth 2 (two) times daily.   simvastatin (ZOCOR) 20 MG tablet Take 1 tablet (20 mg total) by mouth daily.   spironolactone (ALDACTONE) 25 MG tablet Take 1 tablet (25 mg total) by mouth 2 (two) times daily.   ziprasidone (GEODON) 80 MG capsule Take 1 capsule (80 mg total) by mouth 2 (two) times daily with a meal.   [DISCONTINUED] acetaminophen  (TYLENOL ) 325 MG tablet Take 650 mg by mouth every 6 (six) hours as needed for mild pain. (Patient not taking: Reported on 03/25/2024)   [DISCONTINUED] fluticasone (FLONASE) 50 MCG/ACT nasal spray Place into both nostrils. (Patient not taking: Reported on 03/25/2024)   [DISCONTINUED] polyethylene glycol powder (GLYCOLAX /MIRALAX ) 17 GM/SCOOP powder Take one capful daily as needed. (Patient not taking: Reported on 03/25/2024)   No facility-administered encounter medications on file as of 03/25/2024.    Past Medical History:  Diagnosis Date   Diabetes mellitus without complication (HCC)    Hypertension    Ovarian cyst    Schizophrenia (HCC)     Past Surgical History:  Procedure Laterality Date   ABDOMINAL HYSTERECTOMY     BIOPSY  10/24/2023   Procedure: BIOPSY;  Surgeon: Vinetta Greening, DO;  [facility];  Service: Endoscopy;;   ESOPHAGOGASTRODUODENOSCOPY (EGD) WITH PROPOFOL  N/A 10/24/2023   Procedure: ESOPHAGOGASTRODUODENOSCOPY (EGD) WITH PROPOFOL ;  Surgeon: Vinetta Greening, DO;  [facility];  Service: Endoscopy;  Laterality: N/A;  1:45 pm, asa 2   FRACTURE SURGERY Right    ankle   TUBAL LIGATION      Family History  Problem Relation Age of Onset   Kidney failure Mother 49   Stroke Mother    Cancer Father        lung   Mental illness  Sister    Mental illness Other    Cancer Other    Stroke Other     Social History   Socioeconomic History   Marital status: Divorced    Spouse name: Not on file   Number of children: Not on file   Years of education: Not on file   Highest education level: Not on file  Occupational History   Not on file  Tobacco Use   Smoking status: Never   Smokeless tobacco: Never  Vaping Use   Vaping status: Never Used  Substance and Sexual Activity   Alcohol  use: No   Drug use: No   Sexual activity: Not on file  Other Topics Concern   Not on file  Social History Narrative   Not on file   Social Drivers of Health   Financial Resource Strain: Not on file  Food Insecurity: Not on file  Transportation Needs: Not on file  Physical Activity: Not on file  Stress: Not on file  Social Connections: Not on file  Intimate Partner Violence: Not on file    ROS      Objective    BP 133/70   Pulse 79   Ht 5\' 4"  (1.626 m)   Wt 180 lb (81.6 kg)   SpO2 95%   BMI 30.90 kg/m   Physical Exam Vitals and nursing note reviewed. Exam conducted with a chaperone present ([place] support represenatative from [employer] (housing program)).  Constitutional:      Appearance: Normal appearance. She is obese.  Eyes:     Extraocular Movements: Extraocular movements intact.     Pupils: Pupils are equal, round, and reactive to light.  Pulmonary:     Effort: Pulmonary effort is normal.     Breath sounds: Normal breath sounds.  Musculoskeletal:     Cervical back: Normal range of motion and neck supple.  Skin:    General: Skin is warm and dry.  Neurological:     Mental Status: She is alert and oriented to person, place, and time.  Psychiatric:        Attention and Perception: Attention normal.        Mood and Affect: Mood normal.        Speech: Speech normal.        Behavior: Behavior normal.        Thought Content: Thought content normal.        Cognition and Memory: Cognition normal.          Assessment & Plan:   Problem List Items Addressed This Visit       Cardiovascular and Mediastinum   Hypertension associated with diabetes (HCC) - Primary   Stable with current medications.  Refills provided.  Recommend low-sodium diet and regular exercise, at least 150 minutes a week.  Recommend follow-up in 3 months  Relevant Medications   amLODipine  (NORVASC ) 10 MG tablet   carvedilol  (COREG ) 12.5 MG tablet   losartan-hydrochlorothiazide (HYZAAR) 100-25 MG tablet   simvastatin (ZOCOR) 20 MG tablet   spironolactone (ALDACTONE) 25 MG tablet   Other Relevant Orders   CMP14+EGFR (Completed)   Lipid Profile (Completed)   HgB A1c (Completed)     Digestive   Gastroesophageal reflux disease without esophagitis   Stable with Protonix .  Continue with current dose.      Relevant Medications   pantoprazole  (PROTONIX ) 40 MG tablet     Endocrine   Hyperlipidemia associated with type 2 diabetes mellitus (HCC)   Check labs today and refill simvastatin.  Adjust dose if indicated.  Recommend low-fat diet and regular exercise to help with management of this.      Relevant Medications   amLODipine  (NORVASC ) 10 MG tablet   carvedilol  (COREG ) 12.5 MG tablet   losartan-hydrochlorothiazide (HYZAAR) 100-25 MG tablet   simvastatin (ZOCOR) 20 MG tablet   spironolactone (ALDACTONE) 25 MG tablet   Other Relevant Orders   CMP14+EGFR (Completed)   Lipid Profile (Completed)   HgB A1c (Completed)     Other   Schizophrenia (HCC) (Chronic)   Stable at this time.  She has an upcoming appointment with [facility] next month, but will be out of medications before that time.  Will refill Geodon at current dose.  Advised to watch for worsening of symptoms. I filled out forms for Social Security to release funds to the patient instead of care home.  It is felt that with support system in place through [employer] and based on current assessment, that patient is able to manage her own finances.       Relevant Medications   ziprasidone (GEODON) 80 MG capsule    Return in about 3 months (around 06/25/2024) for chronic follow-up with PCP.   Alison Irvine, FNP

## 2024-03-26 ENCOUNTER — Other Ambulatory Visit: Payer: Self-pay

## 2024-03-26 ENCOUNTER — Ambulatory Visit: Payer: Self-pay

## 2024-03-26 DIAGNOSIS — E1169 Type 2 diabetes mellitus with other specified complication: Secondary | ICD-10-CM | POA: Insufficient documentation

## 2024-03-26 DIAGNOSIS — I10 Essential (primary) hypertension: Secondary | ICD-10-CM | POA: Insufficient documentation

## 2024-03-26 DIAGNOSIS — N184 Chronic kidney disease, stage 4 (severe): Secondary | ICD-10-CM

## 2024-03-26 DIAGNOSIS — K219 Gastro-esophageal reflux disease without esophagitis: Secondary | ICD-10-CM | POA: Insufficient documentation

## 2024-03-26 DIAGNOSIS — E1159 Type 2 diabetes mellitus with other circulatory complications: Secondary | ICD-10-CM

## 2024-03-26 LAB — CMP14+EGFR
ALT: 14 IU/L (ref 0–32)
AST: 22 IU/L (ref 0–40)
Albumin: 4.4 g/dL (ref 3.8–4.8)
Alkaline Phosphatase: 104 IU/L (ref 44–121)
BUN/Creatinine Ratio: 14 (ref 12–28)
BUN: 32 mg/dL — ABNORMAL HIGH (ref 8–27)
Bilirubin Total: 0.3 mg/dL (ref 0.0–1.2)
CO2: 16 mmol/L — ABNORMAL LOW (ref 20–29)
Calcium: 9.6 mg/dL (ref 8.7–10.3)
Chloride: 108 mmol/L — ABNORMAL HIGH (ref 96–106)
Creatinine, Ser: 2.28 mg/dL — ABNORMAL HIGH (ref 0.57–1.00)
Globulin, Total: 2.9 g/dL (ref 1.5–4.5)
Glucose: 78 mg/dL (ref 70–99)
Potassium: 6 mmol/L — ABNORMAL HIGH (ref 3.5–5.2)
Sodium: 138 mmol/L (ref 134–144)
Total Protein: 7.3 g/dL (ref 6.0–8.5)
eGFR: 22 mL/min/{1.73_m2} — ABNORMAL LOW (ref >59)

## 2024-03-26 LAB — LIPID PANEL
Chol/HDL Ratio: 5.3 ratio — ABNORMAL HIGH (ref 0.0–4.4)
Cholesterol, Total: 190 mg/dL (ref 100–199)
HDL: 36 mg/dL — ABNORMAL LOW (ref >39)
LDL Chol Calc (NIH): 103 mg/dL — ABNORMAL HIGH (ref 0–99)
Triglycerides: 296 mg/dL — ABNORMAL HIGH (ref 0–149)
VLDL Cholesterol Cal: 51 mg/dL — ABNORMAL HIGH (ref 5–40)

## 2024-03-26 LAB — HEMOGLOBIN A1C
Est. average glucose Bld gHb Est-mCnc: 123 mg/dL
Hgb A1c MFr Bld: 5.9 % — ABNORMAL HIGH (ref 4.8–5.6)

## 2024-03-26 NOTE — Assessment & Plan Note (Signed)
 Stable with current medications.  Refills provided.  Recommend low-sodium diet and regular exercise, at least 150 minutes a week.  Recommend follow-up in 3 months

## 2024-03-26 NOTE — Assessment & Plan Note (Signed)
 Stable with Protonix .  Continue with current dose.

## 2024-03-26 NOTE — Assessment & Plan Note (Addendum)
 Stable at this time.  She has an upcoming appointment with [facility] next month, but will be out of medications before that time.  Will refill Geodon at current dose.  Advised to watch for worsening of symptoms. I filled out forms for Social Security to release funds to the patient instead of care home.  It is felt that with support system in place through [employer] and based on current assessment, that patient is able to manage her own finances.

## 2024-03-26 NOTE — Assessment & Plan Note (Signed)
 Check labs today and refill simvastatin.  Adjust dose if indicated.  Recommend low-fat diet and regular exercise to help with management of this.

## 2024-03-27 NOTE — Progress Notes (Signed)
 Pts mailbox is full unable to leave voicemail

## 2024-04-03 ENCOUNTER — Encounter: Admitting: Obstetrics & Gynecology

## 2024-04-18 ENCOUNTER — Ambulatory Visit: Payer: Self-pay

## 2024-04-24 ENCOUNTER — Emergency Department (HOSPITAL_COMMUNITY)

## 2024-04-24 ENCOUNTER — Encounter (HOSPITAL_COMMUNITY): Payer: Self-pay

## 2024-04-24 ENCOUNTER — Other Ambulatory Visit: Payer: Self-pay

## 2024-04-24 ENCOUNTER — Emergency Department (HOSPITAL_COMMUNITY)
Admission: EM | Admit: 2024-04-24 | Discharge: 2024-04-24 | Disposition: A | Discharge status: Home / Self Care | Attending: Emergency Medicine | Admitting: Emergency Medicine

## 2024-04-24 DIAGNOSIS — E1122 Type 2 diabetes mellitus with diabetic chronic kidney disease: Secondary | ICD-10-CM | POA: Insufficient documentation

## 2024-04-24 DIAGNOSIS — N189 Chronic kidney disease, unspecified: Secondary | ICD-10-CM | POA: Diagnosis not present

## 2024-04-24 DIAGNOSIS — Z79899 Other long term (current) drug therapy: Secondary | ICD-10-CM | POA: Insufficient documentation

## 2024-04-24 DIAGNOSIS — R600 Localized edema: Secondary | ICD-10-CM | POA: Insufficient documentation

## 2024-04-24 DIAGNOSIS — I129 Hypertensive chronic kidney disease with stage 1 through stage 4 chronic kidney disease, or unspecified chronic kidney disease: Secondary | ICD-10-CM | POA: Insufficient documentation

## 2024-04-24 DIAGNOSIS — M7989 Other specified soft tissue disorders: Secondary | ICD-10-CM | POA: Diagnosis present

## 2024-04-24 HISTORY — DX: Hyperlipidemia, unspecified: E78.5

## 2024-04-24 LAB — BRAIN NATRIURETIC PEPTIDE: B Natriuretic Peptide: 140 pg/mL — ABNORMAL HIGH (ref 0.0–100.0)

## 2024-04-24 LAB — COMPREHENSIVE METABOLIC PANEL WITH GFR
ALT: 22 U/L (ref 0–44)
AST: 27 U/L (ref 15–41)
Albumin: 3.7 g/dL (ref 3.5–5.0)
Alkaline Phosphatase: 83 U/L (ref 38–126)
Anion gap: 10 (ref 5–15)
BUN: 26 mg/dL — ABNORMAL HIGH (ref 8–23)
CO2: 22 mmol/L (ref 22–32)
Calcium: 9.5 mg/dL (ref 8.9–10.3)
Chloride: 105 mmol/L (ref 98–111)
Creatinine, Ser: 2.03 mg/dL — ABNORMAL HIGH (ref 0.44–1.00)
GFR, Estimated: 25 mL/min — ABNORMAL LOW (ref >60)
Glucose, Bld: 109 mg/dL — ABNORMAL HIGH (ref 70–99)
Potassium: 4.7 mmol/L (ref 3.5–5.1)
Sodium: 137 mmol/L (ref 135–145)
Total Bilirubin: 0.5 mg/dL (ref 0.0–1.2)
Total Protein: 7.7 g/dL (ref 6.5–8.1)

## 2024-04-24 LAB — CBC
HCT: 33.7 % — ABNORMAL LOW (ref 36.0–46.0)
Hemoglobin: 10.5 g/dL — ABNORMAL LOW (ref 12.0–15.0)
MCH: 33.1 pg (ref 26.0–34.0)
MCHC: 31.2 g/dL (ref 30.0–36.0)
MCV: 106.3 fL — ABNORMAL HIGH (ref 80.0–100.0)
Platelets: 283 10*3/uL (ref 150–400)
RBC: 3.17 MIL/uL — ABNORMAL LOW (ref 3.87–5.11)
RDW: 12.7 % (ref 11.5–15.5)
WBC: 9.5 10*3/uL (ref 4.0–10.5)
nRBC: 0 % (ref 0.0–0.2)

## 2024-04-24 MED ORDER — FUROSEMIDE 20 MG PO TABS: 20.0000 mg | ORAL_TABLET | Freq: Every day | ORAL | 0 refills | Status: DC

## 2024-04-24 NOTE — ED Notes (Signed)
 Pt flagging down multiple ED Staff members requesting pain medication. Pt reports the only reason I came up here was because I've been in so much pain. Pt points to her left ankle when describing her pain.

## 2024-04-24 NOTE — ED Provider Notes (Signed)
 [place] EMERGENCY DEPARTMENT AT [facility] Provider Note  CSN: [id number] Arrival date & time: 04/24/24 1718  Chief Complaint(s) Leg Swelling  HPI Ellen Mcmillan is a 78 y.o. female history of schizophrenia, diabetes, hyperlipidemia, tension, CKD presenting with leg swelling.  Patient reports leg swelling for about a month.  She reports it is worse when she eats salty food or cake and she has been eating these foods.  No shortness of breath.  No abdominal pain.  She reports the pain is in her ankles.  Feels like her feet are swollen.  No injury.  Has been ambulatory.   Past Medical History Past Medical History:  Diagnosis Date   Diabetes mellitus without complication (HCC)    HLD (hyperlipidemia)    Hypertension    Ovarian cyst    Schizophrenia [facility])    Patient Active Problem List   Diagnosis Date Noted   Gastroesophageal reflux disease without esophagitis 03/26/2024   Hyperlipidemia associated with type 2 diabetes mellitus (HCC) 03/26/2024   Hypertension associated with diabetes (HCC) 03/26/2024   Schizophrenia (HCC) 03/25/2024   Duodenal ulcer 12/05/2023   Constipation 12/05/2023   Pulmonary nodules 12/05/2023   Cough 12/05/2023   Abnormal CT scan, small bowel 09/19/2023   Upper abdominal pain 09/19/2023   Pain due to onychomycosis of toenails of both feet 10/28/2022   Home Medication(s) Prior to Admission medications   Medication Sig Start Date End Date Taking? Authorizing Provider  furosemide (LASIX) 20 MG tablet Take 1 tablet (20 mg total) by mouth daily for 5 days. 04/24/24 04/29/24 Yes Francesca Elsie CROME, MD  amLODipine  (NORVASC ) 10 MG tablet Take 1 tablet (10 mg total) by mouth daily. 03/25/24   Bevely Doffing, FNP  carvedilol  (COREG ) 12.5 MG tablet Take 1 tablet (12.5 mg total) by mouth 2 (two) times daily with a meal. 03/25/24   Bevely Doffing, FNP  cetirizine (ZYRTEC) 10 MG tablet Take by mouth. 07/25/22   [provider]   losartan -hydrochlorothiazide (HYZAAR) 100-25 MG tablet Take 1 tablet by mouth daily. 03/25/24   Bevely Doffing, FNP  Multiple Vitamin (MULTIVITAMIN) tablet Take 1 tablet by mouth daily.    [provider]  pantoprazole  (PROTONIX ) 40 MG tablet Take 1 tablet (40 mg total) by mouth 2 (two) times daily. 03/25/24 03/25/25  Bevely Doffing, FNP  simvastatin  (ZOCOR ) 20 MG tablet Take 1 tablet (20 mg total) by mouth daily. 03/25/24   Bevely Doffing, FNP  spironolactone  (ALDACTONE ) 25 MG tablet Take 1 tablet (25 mg total) by mouth 2 (two) times daily. 03/25/24   Bevely Doffing, FNP  ziprasidone  (GEODON ) 80 MG capsule Take 1 capsule (80 mg total) by mouth 2 (two) times daily with a meal. 03/25/24   Bevely Doffing, FNP  Past Surgical History Past Surgical History:  Procedure Laterality Date   ABDOMINAL HYSTERECTOMY     BIOPSY  10/24/2023   Procedure: BIOPSY;  Surgeon: Cindie Carlin POUR, DO;  Location: [facility] ENDO [facility]ITE;  Service: Endoscopy;;   ESOPHAGOGASTRODUODENOSCOPY (EGD) WITH PROPOFOL  N/A 10/24/2023   Procedure: ESOPHAGOGASTRODUODENOSCOPY (EGD) WITH PROPOFOL ;  Surgeon: Cindie Carlin POUR, DO;  Location: [facility] END[facility] SUITE;  Service: Endoscopy;  Laterality: N/A;  1:45 pm, asa 2   FRACTURE SURGERY Right    ankle   TUBAL LIGATION     Family History Family History  Problem Relation Age of Onset   Kidney failure Mother 32   Stroke Mother    Cancer Father        lung   Mental illness Sister    Mental illness Other    Cancer Other    Stroke Other     Social History Social History   Tobacco Use   Smoking status: Never   Smokeless tobacco: Never  Vaping Use   Vaping status: Never Used  Substance Use Topics   Alcohol  use: No   Drug use: No   Allergies Patient has no known allergies.  Review of Systems Review of Systems  All other systems reviewed and are  negative.   Physical Exam Vital Signs  I have reviewed the triage vital signs BP (!) 149/70 (BP Location: Right Arm)   Pulse 88   Temp 97.7 F (36.5 C)   Resp 20   Ht 5' 4 (1.626 m)   Wt 81.6 kg   SpO2 97%   BMI 30.90 kg/m  Physical Exam Vitals and nursing note reviewed.  Constitutional:      General: She is not in acute distress.    Appearance: She is well-developed.  HENT:     Head: Normocephalic and atraumatic.     Mouth/Throat:     Mouth: Mucous membranes are moist.  Eyes:     Pupils: Pupils are equal, round, and reactive to light.  Cardiovascular:     Rate and Rhythm: Normal rate and regular rhythm.     Heart sounds: No murmur heard. Pulmonary:     Effort: Pulmonary effort is normal. No respiratory distress.     Breath sounds: Normal breath sounds.  Abdominal:     General: Abdomen is flat.     Palpations: Abdomen is soft.     Tenderness: There is no abdominal tenderness.  Musculoskeletal:        General: No tenderness.     Right lower leg: Edema present.     Left lower leg: Edema present.  Skin:    General: Skin is warm and dry.  Neurological:     General: No focal deficit present.     Mental Status: She is alert. Mental status is at baseline.  Psychiatric:        Mood and Affect: Mood normal.        Behavior: Behavior normal.     ED Results and Treatments Labs (all labs ordered are listed, but only abnormal results are displayed) Labs Reviewed  CBC - Abnormal; Notable for the following components:      Result Value   RBC 3.17 (*)    Hemoglobin 10.5 (*)    HCT 33.7 (*)    MCV 106.3 (*)    All other components within normal limits  BRAIN NATRIURETIC PEPTIDE - Abnormal; Notable for the following components:   B Natriuretic Peptide 140.0 (*)    All other components within normal  limits  COMPREHENSIVE METABOLIC PANEL WITH GFR - Abnormal; Notable for the following components:   Glucose, Bld 109 (*)    BUN 26 (*)    Creatinine, Ser 2.03 (*)     GFR, Estimated 25 (*)    All other components within normal limits                                                                                                                          Radiology DG Chest 2 View Result Date: 04/24/2024 CLINICAL DATA:  Leg swelling EXAM: CHEST - 2 VIEW COMPARISON:  CT without contrast 01/23/2024 FINDINGS: Slight elevation of the left hemidiaphragm. There is some linear opacity at the bases likely scar or atelectasis. No consolidation, pneumothorax or effusion. Normal cardiopericardial silhouette. Prominent central vasculature. Calcified aorta. IMPRESSION: Slight elevation of left hemidiaphragm. There is some linear opacity left lung base likely scar or atelectasis. Electronically Signed   By: Ranell Bring M.D.   On: 04/24/2024 17:44    Pertinent labs & imaging results that were available during my care of the patient were reviewed by me and considered in my medical decision making (see MDM for details).  Medications Ordered in ED Medications - No data to display                                                                                                                                   Procedures Procedures  (including critical care time)  Medical Decision Making / ED Course   MDM:  78 year old presenting with leg swelling.  Patient well-appearing, physical examination with 2+ bilateral pitting edema.  No tenderness.  No deformity.  No erythema or warmth.  Symptoms most consistent with peripheral edema, likely in the setting of CKD.  Patient appears mildly volume overloaded.  They also may have mild CHF with small elevation in BNP.  Chest x-ray clear and patient denies shortness of breath.  Recommended follow-up with PMD.  Will give small course of Lasix.  Patient stable for discharge.  Will discharge patient to home. All questions answered. Patient comfortable with plan of discharge. Return precautions discussed with patient and specified on the  after visit summary.       Additional history obtained: -Additional history obtained from ems -External records from outside source obtained and reviewed including: Chart review including previous notes, labs, imaging, consultation notes including prior notes  Lab Tests: -I ordered, reviewed, and interpreted labs.   The pertinent results include:   Labs Reviewed  CBC - Abnormal; Notable for the following components:      Result Value   RBC 3.17 (*)    Hemoglobin 10.5 (*)    HCT 33.7 (*)    MCV 106.3 (*)    All other components within normal limits  BRAIN NATRIURETIC PEPTIDE - Abnormal; Notable for the following components:   B Natriuretic Peptide 140.0 (*)    All other components within normal limits  COMPREHENSIVE METABOLIC PANEL WITH GFR - Abnormal; Notable for the following components:   Glucose, Bld 109 (*)    BUN 26 (*)    Creatinine, Ser 2.03 (*)    GFR, Estimated 25 (*)    All other components within normal limits    Notable for CKD, mild BNP elevation  Imaging Studies ordered: I ordered imaging studies including CXR On my interpretation imaging demonstrates clear lungs  I independently visualized and interpreted imaging. I agree with the radiologist interpretation   Medicines ordered and prescription drug management: Meds ordered this encounter  Medications   furosemide (LASIX) 20 MG tablet    Sig: Take 1 tablet (20 mg total) by mouth daily for 5 days.    Dispense:  5 tablet    Refill:  0    -I have reviewed the patients home medicines and have made adjustments as needed   Social Determinants of Health:  Diagnosis or treatment significantly limited by social determinants of health: obesity   Reevaluation: After the interventions noted above, I reevaluated the patient and found that their symptoms have improved  Co morbidities that complicate the patient evaluation  Past Medical History:  Diagnosis Date   Diabetes mellitus without complication  (HCC)    HLD (hyperlipidemia)    Hypertension    Ovarian cyst    Schizophrenia (HCC)       Dispostion: Disposition decision including need for hospitalization was considered, and patient discharged from emergency department.    Final Clinical Impression(s) / ED Diagnoses Final diagnoses:  Peripheral edema     This chart was dictated using voice recognition software.  Despite best efforts to proofread,  errors can occur which can change the documentation meaning.    Francesca Elsie CROME, MD 04/24/24 2116

## 2024-04-24 NOTE — ED Triage Notes (Signed)
 Pt arrives via [facility] from home with c/o bilateral swelling to legs, ankles and feet; onset a month ago. Denies CP/SOB, denies heart or kidney issues. She reports the swelling is now occurring after she eats anything.   BP- 138/78, HR-96, O2-98%

## 2024-04-24 NOTE — Discharge Instructions (Signed)
 We evaluated you for your leg swelling.  Your testing was reassuring.  Please do not eat salty food.  Please follow-up with your primary doctor.  We have given you a 5-day course of a diuretic to help you get rid of the extra fluid.  Please return if you have any trouble breathing or chest pain.

## 2024-04-30 ENCOUNTER — Encounter: Payer: Self-pay | Admitting: Acute Care

## 2024-04-30 ENCOUNTER — Ambulatory Visit (INDEPENDENT_AMBULATORY_CARE_PROVIDER_SITE_OTHER): Admitting: Acute Care

## 2024-04-30 VITALS — BP 155/67 | HR 70 | Temp 97.9°F | Ht 64.0 in | Wt 177.6 lb

## 2024-04-30 DIAGNOSIS — Z809 Family history of malignant neoplasm, unspecified: Secondary | ICD-10-CM

## 2024-04-30 DIAGNOSIS — R911 Solitary pulmonary nodule: Secondary | ICD-10-CM | POA: Diagnosis not present

## 2024-04-30 NOTE — Patient Instructions (Addendum)
 It is good to see you today. Your Lung cancer screening scan in April did show a 5 mm lung nodule. We will do a 3 month follow up scan and take another look at the nodule this month. We will make sure this looks stable. You will follow up with me after the scan, and we will look at it together.  Follow up as needed.  Please contact office for sooner follow up if symptoms do not improve or worsen or seek emergency care

## 2024-04-30 NOTE — Progress Notes (Signed)
 History of Present Illness Ellen Mcmillan is a 78 y.o. female with history of schizophrenia, diabetes, hyperlipidemia, tension, CKD . She self referred for a 5 mm lung nodule noted on LDCT 01/2024.   04/30/2024 Pt. Presents for lung nodule consult for a 5 mm right middle lobe lung nodule noted on a lung cancer screening CT Chest 01/2024. She has a remote history of smoking, only for 3 months when she was 27. She has not smoked since. She had second hand smoke exposure as a child. Her father was a smoker and he died of lung cancer. Her sister died of colon cancer. Her mother had breast cancer, had a mastectomy, and this was cure for her.   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  Her scan LDCT was actually read as a LR 2, 12 month follow up but she self referred to us  because she wants this followed more closely. Pt states she wants to see someone every 3 months for follow up because she is afraid it will explode. We will do a 3 month follow up scan now and see her back after we re-evaluate if the nodule of concern has grown. If it is stable we will consider 6 month follow up, and with goal of getting her back to annual screening.  She denies any hemoptysis. She has not lost any weight. No cough or secretions. No breathing issues. No change in her appetite. She does have seasonal alergies. She takes Flonase for these as needed  PMH includes HTN,Hyperlipidemia, Allergies, Surgical history of hysterectomy and tubal ligation.    Test Results: LDCT 01/23/2024 Cardiovascular: No significant vascular findings. Normal heart size. No pericardial effusion. Minimal coronary artery calcifications   Mediastinum/Nodes: No enlarged mediastinal or axillary lymph nodes. Thyroid  gland, trachea, and esophagus demonstrate no significant findings.   Lungs/Pleura: No infiltrates or consolidations   Stable 2 mm nodule in the right  middle lobe image 75 and stable 5 mm nodule on image 73. No other pulmonary nodules. Lung-RADS 2, benign appearance or behavior. Continue annual screening with low-dose chest CT without contrast in 12 months.   Chronic basilar scar atelectatic changes stable since prior examination.   No other significant findings   Upper Abdomen: No acute abnormality.   Musculoskeletal: No chest wall mass or suspicious bone lesions identified.   IMPRESSION: *Stable 2 mm nodule in the right middle lobe and stable 5 mm nodule in the right middle lobe. No other pulmonary nodules. Lung-RADS 2, benign appearance or behavior. Continue annual screening with low-dose chest CT without contrast in 12 months. *Chronic basilar scar atelectatic changes stable since prior examination.     Latest Ref Rng & Units 04/24/2024    5:53 PM 10/09/2023    4:12 PM 09/17/2023    5:04 PM  CBC  WBC 4.0 - 10.5 K/uL 9.5  7.3  9.3   Hemoglobin 12.0 - 15.0 g/dL 89.4  89.1  88.6   Hematocrit 36.0 - 46.0 % 33.7  32.6  33.9   Platelets 150 - 400 K/uL 283  282  335        Latest Ref Rng & Units 04/24/2024    5:53 PM 03/25/2024    2:12 PM 10/09/2023    4:12 PM  BMP  Glucose 70 - 99 mg/dL 890  78  97   BUN 8 - 23 mg/dL 26  32  29   Creatinine  0.44 - 1.00 mg/dL 7.96  7.71  7.58   BUN/Creat Ratio 12 - 28  14  12    Sodium 135 - 145 mmol/L 137  138  137   Potassium 3.5 - 5.1 mmol/L 4.7  6.0  5.2   Chloride 98 - 111 mmol/L 105  108  105   CO2 22 - 32 mmol/L 22  16  17    Calcium 8.9 - 10.3 mg/dL 9.5  9.6  9.8     BNP    Component Value Date/Time   BNP 140.0 (H) 04/24/2024 1753    ProBNP No results found for: PROBNP  PFT No results found for: FEV1PRE, FEV1POST, FVCPRE, FVCPOST, TLC, DLCOUNC, PREFEV1FVCRT, PSTFEV1FVCRT  DG Chest 2 View Result Date: 04/24/2024 CLINICAL DATA:  Leg swelling EXAM: CHEST - 2 VIEW COMPARISON:  CT without contrast 01/23/2024 FINDINGS: Slight elevation of the left  hemidiaphragm. There is some linear opacity at the bases likely scar or atelectasis. No consolidation, pneumothorax or effusion. Normal cardiopericardial silhouette. Prominent central vasculature. Calcified aorta. IMPRESSION: Slight elevation of left hemidiaphragm. There is some linear opacity left lung base likely scar or atelectasis. Electronically Signed   By: Ranell Bring M.D.   On: 04/24/2024 17:44     Past medical hx Past Medical History:  Diagnosis Date   Diabetes mellitus without complication (HCC)    HLD (hyperlipidemia)    Hypertension    Ovarian cyst    Schizophrenia (HCC)      Social History   Tobacco Use   Smoking status: Never   Smokeless tobacco: Never   Tobacco comments:    Smoked when she was 27 and only did it for 3 months  Vaping Use   Vaping status: Never Used  Substance Use Topics   Alcohol  use: No   Drug use: No    Ellen Mcmillan reports that she has never smoked. She has never used smokeless tobacco. She reports that she does not drink alcohol  and does not use drugs.  Tobacco Cessation: Counseling given: Not Answered Tobacco comments: Smoked when she was 27 and only did it for 3 months Remote smoker at age of 45 for 3 months.  Past surgical hx, Family hx, Social hx all reviewed.  Current Outpatient Medications on File Prior to Visit  Medication Sig   amLODipine  (NORVASC ) 10 MG tablet Take 1 tablet (10 mg total) by mouth daily.   carvedilol  (COREG ) 12.5 MG tablet Take 1 tablet (12.5 mg total) by mouth 2 (two) times daily with a meal.   cetirizine (ZYRTEC) 10 MG tablet Take by mouth.   furosemide  (LASIX ) 20 MG tablet Take 1 tablet (20 mg total) by mouth daily for 5 days.   losartan -hydrochlorothiazide (HYZAAR) 100-25 MG tablet Take 1 tablet by mouth daily.   Multiple Vitamin (MULTIVITAMIN) tablet Take 1 tablet by mouth daily.   pantoprazole  (PROTONIX ) 40 MG tablet Take 1 tablet (40 mg total) by mouth 2 (two) times daily.   simvastatin  (ZOCOR ) 20 MG  tablet Take 1 tablet (20 mg total) by mouth daily.   ziprasidone  (GEODON ) 80 MG capsule Take 1 capsule (80 mg total) by mouth 2 (two) times daily with a meal.   spironolactone  (ALDACTONE ) 25 MG tablet Take 1 tablet (25 mg total) by mouth 2 (two) times daily. (Patient not taking: Reported on 04/30/2024)   No current facility-administered medications on file prior to visit.     No Known Allergies  Review Of Systems:  Constitutional:   No  weight loss, night sweats,  Fevers, chills, fatigue, or  lassitude.  HEENT:   No headaches,  Difficulty swallowing,  Tooth/dental problems, or  Sore throat,                No sneezing, itching, ear ache, nasal congestion, post nasal drip,   CV:  No chest pain,  Orthopnea, PND, swelling in lower extremities, anasarca, dizziness, palpitations, syncope.   GI  No heartburn, indigestion, abdominal pain, nausea, vomiting, diarrhea, change in bowel habits, loss of appetite, bloody stools.   Resp: No shortness of breath with exertion or at rest.  No excess mucus, no productive cough,  No non-productive cough,  No coughing up of blood.  No change in color of mucus.  No wheezing.  No chest wall deformity  Skin: no rash or lesions.  GU: no dysuria, change in color of urine, no urgency or frequency.  No flank pain, no hematuria   MS:  No joint pain or swelling.  No decreased range of motion.  No back pain.  Psych:  No change in mood or affect. No depression or anxiety.  No memory loss.   Vital Signs BP (!) 155/67 (BP Location: Left Arm, Patient Position: Sitting, Cuff Size: Normal)   Pulse 70   Temp 97.9 F (36.6 C) (Oral)   Ht 5' 4 (1.626 m)   Wt 177 lb 9.6 oz (80.6 kg)   SpO2 96%   BMI 30.48 kg/m    Physical Exam:  General- No distress,  A&Ox3, pleasant ENT: No sinus tenderness, TM clear, pale nasal mucosa, no oral exudate,no post nasal drip, no LAN Cardiac: S1, S2, regular rate and rhythm, no murmur Chest: No wheeze/ rales/ dullness; no accessory  muscle use, no nasal flaring, no sternal retractions, slightly diminished per bases. Abd.: Soft Non-tender, ND, BS +, Body mass index is 30.48 kg/m.  Ext: No clubbing cyanosis, edema, no obvious deformities Neuro:  normal strength, MAE x 4, A&O x 3 Skin: No rashes, warm and dry, no obvious skin lesions Psych: normal mood and behavior   Assessment/Plan 5 mm lung nodule noted on LDCT 01/2024. Very remote smoker 50 years ago Family history of cancer Plan Your Lung cancer screening scan in April 2025 did show a 5 mm lung nodule. We will do a 3 month follow up scan and take another look at the nodule this month. We will make sure this looks stable. You will follow up with me after the scan, and we will look at it together.  Then we will decide when to do your next scan.  Call if you need us  sooner. Please contact office for sooner follow up if symptoms do not improve or worsen or seek emergency care   Follow up as needed.   I spent 20 minutes dedicated to the care of this patient on the date of this encounter to include pre-visit review of records, face-to-face time with the patient discussing conditions above, post visit ordering of testing, clinical documentation with the electronic health record, making appropriate referrals as documented, and communicating necessary information to the patient's healthcare team.    Lauraine JULIANNA Lites, NP 04/30/2024  1:06 PM

## 2024-05-06 ENCOUNTER — Ambulatory Visit: Payer: Self-pay

## 2024-05-06 NOTE — Telephone Encounter (Signed)
 FYI Only or Action Required?: FYI only for provider.  Patient was last seen in primary care on 03/25/2024 by Bevely Doffing, FNP.  Called Nurse Triage reporting Leg Swelling.  Symptoms began 2 to 3 weeks.  Interventions attempted: Prescription medications: lasix .  Symptoms are: gradually worsening.  Triage Disposition: See PCP When Office is Open (Within 3 Days)  Patient/caregiver understands and will follow disposition?: Yes     Copied from CRM [id number]. Topic: Clinical - Red Word Triage >> May 06, 2024 11:49 AM Powell HERO wrote: Red Word that prompted transfer to Nurse Triage: Both of patients legs and ankles are swollen hurting, says she's out of the water pills from ER, was only given 5. Wanting to be seen sooner than next appointment Reason for Disposition  [1] MILD swelling of both ankles (i.e., pedal edema) AND [2] new-onset or getting worse  Answer Assessment - Initial Assessment Questions 1. ONSET: When did the swelling start? (e.g., minutes, hours, days)     X 2 to 3 weeks 2. LOCATION: What part of the leg is swollen?  Are both legs swollen or just one leg?     Bilateral legs and feet swelling 3. SEVERITY: How bad is the swelling? (e.g., localized; mild, moderate, severe)     moderate 4. REDNESS: Is there redness or signs of infection? On feet 5. PAIN: Is the swelling painful to touch? If Yes, ask: How painful is it?   (Scale 1-10; mild, moderate or severe)     8/10 6. FEVER: Do you have a fever? If Yes, ask: What is it, how was it measured, and when did it start?      no 7. CAUSE: What do you think is causing the leg swelling?     unknown 8. MEDICAL HISTORY: Do you have a history of blood clots (e.g., DVT), cancer, heart failure, kidney disease, or liver failure?     na 9. RECURRENT SYMPTOM: Have you had leg swelling before? If Yes, ask: When was the last time? What happened that time? yes 10. OTHER SYMPTOMS: Do you have any other  symptoms? (e.g., chest pain, difficulty breathing)       no 11. PREGNANCY: Is there any chance you are pregnant? When was your last menstrual period?       na  Protocols used: Leg Swelling and Edema-A-AH

## 2024-05-06 NOTE — Telephone Encounter (Signed)
Patient scheduled 7/16.

## 2024-05-08 ENCOUNTER — Ambulatory Visit

## 2024-05-08 VITALS — BP 163/65 | HR 96 | Ht 64.0 in | Wt 186.0 lb

## 2024-05-08 DIAGNOSIS — E1159 Type 2 diabetes mellitus with other circulatory complications: Secondary | ICD-10-CM | POA: Diagnosis not present

## 2024-05-08 DIAGNOSIS — I152 Hypertension secondary to endocrine disorders: Secondary | ICD-10-CM

## 2024-05-08 DIAGNOSIS — R600 Localized edema: Secondary | ICD-10-CM

## 2024-05-08 DIAGNOSIS — N184 Chronic kidney disease, stage 4 (severe): Secondary | ICD-10-CM | POA: Diagnosis not present

## 2024-05-08 MED ORDER — SPIRONOLACTONE 25 MG PO TABS: 25.0000 mg | ORAL_TABLET | Freq: Every day | ORAL | 3 refills | Status: DC

## 2024-05-08 NOTE — Progress Notes (Signed)
 Established Patient Office Visit  Subjective   Patient ID: Ellen Mcmillan, female    DOB: 1946/03/07  Age: 78 y.o. MRN: [id number]  Chief Complaint  Patient presents with   Medical Management of Chronic Issues    Pt has bilateral ankle,feet and leg swelling     HPI  Pt was seen in ED at [facility] on 04/24/2024 for the following:   78 year old presenting with leg swelling.   Patient well-appearing, physical examination with 2+ bilateral pitting edema.  No tenderness.  No deformity.  No erythema or warmth.   Symptoms most consistent with peripheral edema, likely in the setting of CKD.  Patient appears mildly volume overloaded.  They also may have mild CHF with small elevation in BNP.  Chest x-ray clear and patient denies shortness of breath.  Recommended follow-up with PMD.  Will give small course of Lasix .  Patient stable for discharge. Patient Active Problem List   Diagnosis Date Noted   Acute on chronic heart failure with preserved ejection fraction (HFpEF) (HCC) 05/13/2024   CKD (chronic kidney disease), stage IV (HCC) 05/13/2024   Gastroesophageal reflux disease without esophagitis 03/26/2024   Hyperlipidemia associated with type 2 diabetes mellitus (HCC) 03/26/2024   Hypertension 03/26/2024   Schizophrenia (HCC) 03/25/2024   Duodenal ulcer 12/05/2023   Constipation 12/05/2023   Pulmonary nodules 12/05/2023   Cough 12/05/2023   Abnormal CT scan, small bowel 09/19/2023   Upper abdominal pain 09/19/2023   Pain due to onychomycosis of toenails of both feet 10/28/2022      ROS    Objective:     BP (!) 163/65   Pulse 96   Ht 5' 4 (1.626 m)   Wt 186 lb (84.4 kg)   SpO2 91%   BMI 31.93 kg/m  BP Readings from Last 3 Encounters:  05/15/24 (!) 160/50  05/08/24 (!) 163/65  04/30/24 (!) 155/67   Wt Readings from Last 3 Encounters:  05/15/24 173 lb 15.1 oz (78.9 kg)  05/08/24 186 lb (84.4 kg)  04/30/24 177 lb 9.6 oz (80.6 kg)     Physical Exam Vitals and nursing note  reviewed.  Constitutional:      Appearance: Normal appearance.  HENT:     Head: Normocephalic.  Eyes:     Extraocular Movements: Extraocular movements intact.     Pupils: Pupils are equal, round, and reactive to light.  Cardiovascular:     Rate and Rhythm: Normal rate and regular rhythm.  Pulmonary:     Effort: Pulmonary effort is normal.     Breath sounds: Normal breath sounds.  Musculoskeletal:     Cervical back: Normal range of motion and neck supple.     Right lower leg: No tenderness. 2+ Edema present.     Left lower leg: No tenderness. 2+ Edema present.     Right ankle: Swelling present.     Left ankle: Swelling present.     Right foot: Swelling present.     Left foot: Swelling present.  Neurological:     Mental Status: She is alert and oriented to person, place, and time.  Psychiatric:        Mood and Affect: Mood normal.        Thought Content: Thought content normal.      No results found for any visits on 05/08/24.  Last CBC Lab Results  Component Value Date   WBC 7.2 05/15/2024   HGB 9.9 (L) 05/15/2024   HCT 30.6 (L) 05/15/2024   MCV 99.7  05/15/2024   MCH 32.2 05/15/2024   RDW 12.3 05/15/2024   PLT 282 05/15/2024   Last metabolic panel Lab Results  Component Value Date   GLUCOSE 118 (H) 05/15/2024   NA 139 05/15/2024   K 4.3 05/15/2024   CL 100 05/15/2024   CO2 27 05/15/2024   BUN 27 (H) 05/15/2024   CREATININE 2.30 (H) 05/15/2024   GFRNONAA 21 (L) 05/15/2024   CALCIUM  9.2 05/15/2024   PROT 7.4 05/13/2024   ALBUMIN 3.6 05/13/2024   LABGLOB 2.9 03/25/2024   BILITOT 0.5 05/13/2024   ALKPHOS 85 05/13/2024   AST 25 05/13/2024   ALT 26 05/13/2024   ANIONGAP 12 05/15/2024   Last lipids Lab Results  Component Value Date   CHOL 190 03/25/2024   HDL 36 (L) 03/25/2024   LDLCALC 103 (H) 03/25/2024   TRIG 296 (H) 03/25/2024   CHOLHDL 5.3 (H) 03/25/2024   Last hemoglobin A1c Lab Results  Component Value Date   HGBA1C 5.9 (H) 03/25/2024       The 10-year ASCVD risk score (Arnett DK, et al., 2019) is: 36.4%    Assessment & Plan:   Problem List Items Addressed This Visit       Cardiovascular and Mediastinum   Hypertension   Elevated today.  Will restart spironolactone  and refer her to nephrology for further evaluation.       Relevant Medications   spironolactone  (ALDACTONE ) 25 MG tablet   Other Visit Diagnoses       Bilateral lower extremity edema    -  Primary   restart spironolactone .  recommend elevating legs when at rest and wearing compression hose.   Relevant Medications   spironolactone  (ALDACTONE ) 25 MG tablet     Chronic kidney disease, stage 4 (severe) (HCC)       refer to nephrology for furthere evaluation.   Relevant Orders   Ambulatory referral to Nephrology       No follow-ups on file.    Leita Longs, FNP

## 2024-05-13 ENCOUNTER — Encounter (HOSPITAL_COMMUNITY): Payer: Self-pay | Admitting: Emergency Medicine

## 2024-05-13 ENCOUNTER — Inpatient Hospital Stay (HOSPITAL_COMMUNITY)
Admission: EM | Admit: 2024-05-13 | Discharge: 2024-05-15 | DRG: 291 | Disposition: A | Discharge status: Home / Self Care | Attending: Family Medicine | Admitting: Family Medicine

## 2024-05-13 ENCOUNTER — Inpatient Hospital Stay (HOSPITAL_COMMUNITY)

## 2024-05-13 ENCOUNTER — Other Ambulatory Visit: Payer: Self-pay

## 2024-05-13 ENCOUNTER — Emergency Department (HOSPITAL_COMMUNITY)

## 2024-05-13 DIAGNOSIS — F209 Schizophrenia, unspecified: Secondary | ICD-10-CM | POA: Diagnosis present

## 2024-05-13 DIAGNOSIS — K219 Gastro-esophageal reflux disease without esophagitis: Secondary | ICD-10-CM | POA: Diagnosis present

## 2024-05-13 DIAGNOSIS — I509 Heart failure, unspecified: Secondary | ICD-10-CM | POA: Diagnosis present

## 2024-05-13 DIAGNOSIS — I5031 Acute diastolic (congestive) heart failure: Secondary | ICD-10-CM | POA: Diagnosis not present

## 2024-05-13 DIAGNOSIS — Z79899 Other long term (current) drug therapy: Secondary | ICD-10-CM

## 2024-05-13 DIAGNOSIS — I130 Hypertensive heart and chronic kidney disease with heart failure and stage 1 through stage 4 chronic kidney disease, or unspecified chronic kidney disease: Principal | ICD-10-CM | POA: Diagnosis present

## 2024-05-13 DIAGNOSIS — D631 Anemia in chronic kidney disease: Secondary | ICD-10-CM | POA: Diagnosis present

## 2024-05-13 DIAGNOSIS — Z87891 Personal history of nicotine dependence: Secondary | ICD-10-CM | POA: Diagnosis not present

## 2024-05-13 DIAGNOSIS — Z818 Family history of other mental and behavioral disorders: Secondary | ICD-10-CM | POA: Diagnosis not present

## 2024-05-13 DIAGNOSIS — Z841 Family history of disorders of kidney and ureter: Secondary | ICD-10-CM | POA: Diagnosis not present

## 2024-05-13 DIAGNOSIS — E785 Hyperlipidemia, unspecified: Secondary | ICD-10-CM | POA: Diagnosis present

## 2024-05-13 DIAGNOSIS — E669 Obesity, unspecified: Secondary | ICD-10-CM | POA: Diagnosis present

## 2024-05-13 DIAGNOSIS — I5033 Acute on chronic diastolic (congestive) heart failure: Secondary | ICD-10-CM | POA: Diagnosis present

## 2024-05-13 DIAGNOSIS — M79606 Pain in leg, unspecified: Secondary | ICD-10-CM | POA: Diagnosis not present

## 2024-05-13 DIAGNOSIS — E1159 Type 2 diabetes mellitus with other circulatory complications: Secondary | ICD-10-CM

## 2024-05-13 DIAGNOSIS — Z823 Family history of stroke: Secondary | ICD-10-CM | POA: Diagnosis not present

## 2024-05-13 DIAGNOSIS — Z90710 Acquired absence of both cervix and uterus: Secondary | ICD-10-CM

## 2024-05-13 DIAGNOSIS — E1122 Type 2 diabetes mellitus with diabetic chronic kidney disease: Secondary | ICD-10-CM | POA: Diagnosis present

## 2024-05-13 DIAGNOSIS — E1169 Type 2 diabetes mellitus with other specified complication: Secondary | ICD-10-CM | POA: Diagnosis present

## 2024-05-13 DIAGNOSIS — N184 Chronic kidney disease, stage 4 (severe): Secondary | ICD-10-CM | POA: Diagnosis present

## 2024-05-13 DIAGNOSIS — R600 Localized edema: Secondary | ICD-10-CM

## 2024-05-13 DIAGNOSIS — I10 Essential (primary) hypertension: Secondary | ICD-10-CM | POA: Diagnosis not present

## 2024-05-13 LAB — CBC WITH DIFFERENTIAL/PLATELET
Abs Immature Granulocytes: 0.02 K/uL (ref 0.00–0.07)
Basophils Absolute: 0 K/uL (ref 0.0–0.1)
Basophils Relative: 0 %
Eosinophils Absolute: 0.1 K/uL (ref 0.0–0.5)
Eosinophils Relative: 2 %
HCT: 28.8 % — ABNORMAL LOW (ref 36.0–46.0)
Hemoglobin: 9.3 g/dL — ABNORMAL LOW (ref 12.0–15.0)
Immature Granulocytes: 0 %
Lymphocytes Relative: 35 %
Lymphs Abs: 2.6 K/uL (ref 0.7–4.0)
MCH: 32.7 pg (ref 26.0–34.0)
MCHC: 32.3 g/dL (ref 30.0–36.0)
MCV: 101.4 fL — ABNORMAL HIGH (ref 80.0–100.0)
Monocytes Absolute: 0.9 K/uL (ref 0.1–1.0)
Monocytes Relative: 11 %
Neutro Abs: 3.9 K/uL (ref 1.7–7.7)
Neutrophils Relative %: 52 %
Platelets: 253 K/uL (ref 150–400)
RBC: 2.84 MIL/uL — ABNORMAL LOW (ref 3.87–5.11)
RDW: 12.4 % (ref 11.5–15.5)
WBC: 7.6 K/uL (ref 4.0–10.5)
nRBC: 0 % (ref 0.0–0.2)

## 2024-05-13 LAB — COMPREHENSIVE METABOLIC PANEL WITH GFR
ALT: 26 U/L (ref 0–44)
AST: 25 U/L (ref 15–41)
Albumin: 3.6 g/dL (ref 3.5–5.0)
Alkaline Phosphatase: 85 U/L (ref 38–126)
Anion gap: 12 (ref 5–15)
BUN: 25 mg/dL — ABNORMAL HIGH (ref 8–23)
CO2: 18 mmol/L — ABNORMAL LOW (ref 22–32)
Calcium: 8.9 mg/dL (ref 8.9–10.3)
Chloride: 103 mmol/L (ref 98–111)
Creatinine, Ser: 2.16 mg/dL — ABNORMAL HIGH (ref 0.44–1.00)
GFR, Estimated: 23 mL/min — ABNORMAL LOW (ref >60)
Glucose, Bld: 125 mg/dL — ABNORMAL HIGH (ref 70–99)
Potassium: 4.9 mmol/L (ref 3.5–5.1)
Sodium: 133 mmol/L — ABNORMAL LOW (ref 135–145)
Total Bilirubin: 0.5 mg/dL (ref 0.0–1.2)
Total Protein: 7.4 g/dL (ref 6.5–8.1)

## 2024-05-13 LAB — BRAIN NATRIURETIC PEPTIDE: B Natriuretic Peptide: 303 pg/mL — ABNORMAL HIGH (ref 0.0–100.0)

## 2024-05-13 MED ORDER — SENNA 8.6 MG PO TABS: 1.0000 | ORAL_TABLET | Freq: Every day | ORAL | Status: DC | PRN

## 2024-05-13 MED ORDER — FUROSEMIDE 10 MG/ML IJ SOLN
40.0000 mg | Freq: Once | INTRAMUSCULAR | Status: AC
  Administered 2024-05-13: 40 mg via INTRAVENOUS
  Filled 2024-05-13: qty 4

## 2024-05-13 MED ORDER — AMLODIPINE BESYLATE 5 MG PO TABS
10.0000 mg | ORAL_TABLET | Freq: Every day | ORAL | Status: DC
Start: 2024-05-14 — End: 2024-05-14
  Administered 2024-05-14: 10 mg via ORAL
  Filled 2024-05-13: qty 2

## 2024-05-13 MED ORDER — SODIUM CHLORIDE 0.9% FLUSH
3.0000 mL | Freq: Two times a day (BID) | INTRAVENOUS | Status: DC
  Administered 2024-05-13 – 2024-05-15 (×4): 3 mL via INTRAVENOUS

## 2024-05-13 MED ORDER — HEPARIN SODIUM (PORCINE) 5000 UNIT/ML IJ SOLN
5000.0000 [IU] | Freq: Three times a day (TID) | INTRAMUSCULAR | Status: DC
  Administered 2024-05-13 – 2024-05-15 (×5): 5000 [IU] via SUBCUTANEOUS
  Filled 2024-05-13 (×5): qty 1

## 2024-05-13 MED ORDER — HYDROMORPHONE HCL 1 MG/ML IJ SOLN
0.5000 mg | Freq: Once | INTRAMUSCULAR | Status: AC
  Administered 2024-05-13: 0.5 mg via INTRAVENOUS
  Filled 2024-05-13: qty 0.5

## 2024-05-13 MED ORDER — ACETAMINOPHEN 325 MG PO TABS
650.0000 mg | ORAL_TABLET | Freq: Four times a day (QID) | ORAL | Status: DC | PRN
  Administered 2024-05-14 – 2024-05-15 (×2): 650 mg via ORAL
  Filled 2024-05-13 (×3): qty 2

## 2024-05-13 MED ORDER — ACETAMINOPHEN 650 MG RE SUPP: 650.0000 mg | Freq: Four times a day (QID) | RECTAL | Status: DC | PRN

## 2024-05-13 MED ORDER — PANTOPRAZOLE SODIUM 40 MG PO TBEC
40.0000 mg | DELAYED_RELEASE_TABLET | Freq: Two times a day (BID) | ORAL | Status: DC
  Administered 2024-05-13 – 2024-05-15 (×4): 40 mg via ORAL
  Filled 2024-05-13 (×4): qty 1

## 2024-05-13 MED ORDER — SIMVASTATIN 20 MG PO TABS
20.0000 mg | ORAL_TABLET | Freq: Every day | ORAL | Status: DC
  Administered 2024-05-14: 20 mg via ORAL
  Filled 2024-05-13: qty 1

## 2024-05-13 MED ORDER — ZIPRASIDONE HCL 80 MG PO CAPS
80.0000 mg | ORAL_CAPSULE | Freq: Two times a day (BID) | ORAL | Status: DC
Start: 2024-05-14 — End: 2024-05-14
  Filled 2024-05-13 (×4): qty 1

## 2024-05-13 MED ORDER — FUROSEMIDE 10 MG/ML IJ SOLN
40.0000 mg | Freq: Two times a day (BID) | INTRAMUSCULAR | Status: DC
  Administered 2024-05-14 (×2): 40 mg via INTRAVENOUS
  Filled 2024-05-13 (×2): qty 4

## 2024-05-13 MED ORDER — SPIRONOLACTONE 25 MG PO TABS
25.0000 mg | ORAL_TABLET | Freq: Every day | ORAL | Status: DC
Start: 2024-05-14 — End: 2024-05-15
  Administered 2024-05-14 – 2024-05-15 (×2): 25 mg via ORAL
  Filled 2024-05-13 (×2): qty 1

## 2024-05-13 MED ORDER — CARVEDILOL 12.5 MG PO TABS
12.5000 mg | ORAL_TABLET | Freq: Two times a day (BID) | ORAL | Status: DC
  Administered 2024-05-14 – 2024-05-15 (×3): 12.5 mg via ORAL
  Filled 2024-05-13 (×3): qty 1

## 2024-05-13 MED ORDER — PROCHLORPERAZINE EDISYLATE 10 MG/2ML IJ SOLN: 5.0000 mg | Freq: Four times a day (QID) | INTRAMUSCULAR | Status: DC | PRN

## 2024-05-13 NOTE — ED Triage Notes (Signed)
 Pt BIB EMS from home with c/o bilateral leg swelling.

## 2024-05-13 NOTE — H&P (Signed)
 History and Physical    VESTA WHEELAND FMW:[id number] DOB: 1946/02/05 DOA: 05/13/2024  PCP: Bevely Doffing, FNP   Patient coming from: Home   Chief Complaint: leg swelling   HPI: Ellen Mcmillan is a 78 y.o. female with medical history significant for hypertension, hyperlipidemia, schizophrenia, and CKD stage IV who presents with bilateral lower extremity swelling.  Patient reports that she was taken off of Aldactone  in May by her PCP and began to develop bilateral lower extremity edema in June.  She was seen in the emergency department for leg swelling on 04/24/2024, was given a 5-day course of Lasix , and also had Aldactone  resumed by her PCP.  She improved initially with the Lasix , but the legs have become more swollen again.  She denies chest pain and has not felt particularly short of breath.  ED Course: Upon arrival to the ED, patient is found to be afebrile and saturating mid 90s on room air with normal HR and stable BP.  Labs are most notable for creatinine 2.16, normal WBC, hemoglobin 9.3, and BNP 303.  Chest x-ray demonstrates mild interstitial coarsening.  Patient was treated with 40 mg IV Lasix  in the ED.  Review of Systems:  All other systems reviewed and apart from HPI, are negative.  Past Medical History:  Diagnosis Date   CKD (chronic kidney disease), stage IV (HCC) 05/13/2024   Diabetes mellitus without complication (HCC)    HLD (hyperlipidemia)    Hypertension    Ovarian cyst    Schizophrenia [facility])     Past Surgical History:  Procedure Laterality Date   ABDOMINAL HYSTERECTOMY     BIOPSY  10/24/2023   Procedure: BIOPSY;  Surgeon: Cindie Carlin POUR, DO;  Location: [facility] ENDO [facility]ITE;  Service: Endoscopy;;   ESOPHAGOGASTRODUODENOSCOPY (EGD) WITH PROPOFOL  N/A 10/24/2023   Procedure: ESOPHAGOGASTRODUODENOSCOPY (EGD) WITH PROPOFOL ;  Surgeon: Cindie Carlin POUR, DO;  Location: [facility];  Service: Endoscopy;  Laterality: N/A;  1:45 pm, asa 2   FRACTURE SURGERY Right     ankle   TUBAL LIGATION      Social History:   reports that she has never smoked. She has never used smokeless tobacco. She reports that she does not drink alcohol  and does not use drugs.  No Known Allergies  Family History  Problem Relation Age of Onset   Kidney failure Mother 54   Stroke Mother    Cancer Father        lung   Mental illness Sister    Mental illness Other    Cancer Other    Stroke Other      Prior to Admission medications   Medication Sig Start Date End Date Taking? Authorizing Provider  amLODipine  (NORVASC ) 10 MG tablet Take 1 tablet (10 mg total) by mouth daily. 03/25/24   Bevely Doffing, FNP  amoxicillin (AMOXIL) 500 MG capsule Take 500 mg by mouth 3 (three) times daily. 04/27/24   [provider]  carvedilol  (COREG ) 12.5 MG tablet Take 1 tablet (12.5 mg total) by mouth 2 (two) times daily with a meal. 03/25/24   Bevely Doffing, FNP  cetirizine (ZYRTEC) 10 MG tablet Take by mouth. 07/25/22   [provider]  ibuprofen (ADVIL) 400 MG tablet Take 400 mg by mouth every 6 (six) hours as needed. 04/27/24   [provider]  losartan -hydrochlorothiazide (HYZAAR) 100-25 MG tablet Take 1 tablet by mouth daily. 03/25/24   Bevely Doffing, FNP  Multiple Vitamin (MULTIVITAMIN) tablet Take 1 tablet by mouth daily.  [provider]  pantoprazole  (PROTONIX ) 40 MG tablet Take 1 tablet (40 mg total) by mouth 2 (two) times daily. 03/25/24 03/25/25  Bevely Doffing, FNP  simvastatin  (ZOCOR ) 20 MG tablet Take 1 tablet (20 mg total) by mouth daily. 03/25/24   Bevely Doffing, FNP  spironolactone  (ALDACTONE ) 25 MG tablet Take 1 tablet (25 mg total) by mouth daily. 05/08/24   Bevely Doffing, FNP  ziprasidone  (GEODON ) 80 MG capsule Take 1 capsule (80 mg total) by mouth 2 (two) times daily with a meal. 03/25/24   Bevely Doffing, FNP    Physical Exam: Vitals:   05/13/24 1915 05/13/24 2130  BP: (!) 148/44 (!) 137/52  Pulse: 77 73  Resp: 18 17  Temp: 98.2 F (36.8 C)    TempSrc: Oral   SpO2: 94% 95%    Constitutional: NAD, no pallor or diaphoresis   Eyes: PERTLA, lids and conjunctivae normal ENMT: Mucous membranes are moist. Posterior pharynx clear of any exudate or lesions.   Neck: supple, no masses  Respiratory: no wheezing. No accessory muscle use.  Cardiovascular: S1 & S2 heard, regular rate and rhythm. Pretibial pitting edema b/l.   Abdomen: No tenderness, soft. Bowel sounds active.  Musculoskeletal: no clubbing / cyanosis. No joint deformity upper and lower extremities.   Skin: Crusted lesion at medial right ankle. Warm, dry, well-perfused. Neurologic: CN 2-12 grossly intact. Moving all extremities. Alert and oriented.  Psychiatric: Calm. Cooperative.    Labs and Imaging on Admission: I have personally reviewed following labs and imaging studies  CBC: Recent Labs  Lab 05/13/24 1955  WBC 7.6  NEUTROABS 3.9  HGB 9.3*  HCT 28.8*  MCV 101.4*  PLT 253   Basic Metabolic Panel: Recent Labs  Lab 05/13/24 1955  NA 133*  K 4.9  CL 103  CO2 18*  GLUCOSE 125*  BUN 25*  CREATININE 2.16*  CALCIUM  8.9   GFR: Estimated Creatinine Clearance: 23.3 mL/min (A) (by C-G formula based on SCr of 2.16 mg/dL (H)). Liver Function Tests: Recent Labs  Lab 05/13/24 1955  AST 25  ALT 26  ALKPHOS 85  BILITOT 0.5  PROT 7.4  ALBUMIN 3.6   No results for input(s): LIPASE, AMYLASE in the last 168 hours. No results for input(s): AMMONIA in the last 168 hours. Coagulation Profile: No results for input(s): INR, PROTIME in the last 168 hours. Cardiac Enzymes: No results for input(s): CKTOTAL, CKMB, CKMBINDEX, TROPONINI in the last 168 hours. BNP (last 3 results) No results for input(s): PROBNP in the last 8760 hours. HbA1C: No results for input(s): HGBA1C in the last 72 hours. CBG: No results for input(s): GLUCAP in the last 168 hours. Lipid Profile: No results for input(s): CHOL, HDL, LDLCALC, TRIG, CHOLHDL,  LDLDIRECT in the last 72 hours. Thyroid  Function Tests: No results for input(s): TSH, T4TOTAL, FREET4, T3FREE, THYROIDAB in the last 72 hours. Anemia Panel: No results for input(s): VITAMINB12, FOLATE, FERRITIN, TIBC, IRON, RETICCTPCT in the last 72 hours. Urine analysis:    Component Value Date/Time   COLORURINE YELLOW 09/17/2023 1934   APPEARANCEUR HAZY (A) 09/17/2023 1934   LABSPEC 1.011 09/17/2023 1934   PHURINE 5.0 09/17/2023 1934   GLUCOSEU NEGATIVE 09/17/2023 1934   HGBUR NEGATIVE 09/17/2023 1934   BILIRUBINUR NEGATIVE 09/17/2023 1934   KETONESUR NEGATIVE 09/17/2023 1934   PROTEINUR NEGATIVE 09/17/2023 1934   NITRITE NEGATIVE 09/17/2023 1934   LEUKOCYTESUR NEGATIVE 09/17/2023 1934   Sepsis Labs: @LABRCNTIP (procalcitonin:4,lacticidven:4) )No results found for this or any previous visit (from the past  240 hours).   Radiological Exams on Admission: DG Chest Portable 1 View Result Date: 05/13/2024 CLINICAL DATA:  Chest pain.  Bilateral leg swelling. EXAM: PORTABLE CHEST 1 VIEW COMPARISON:  04/24/2024 FINDINGS: Stable heart size and mediastinal contours. Mild interstitial coarsening. No confluent airspace disease. Subsegmental atelectasis/scarring at the lung bases. No pleural effusion. On limited assessment, no acute osseous findings. IMPRESSION: Mild interstitial coarsening, may represent mild pulmonary edema. Electronically Signed   By: Andrea Gasman M.D.   On: 05/13/2024 20:48    Assessment/Plan   1. Acute on chronic HFpEF  - EF was 60-65% in July 2023  - Check EKG, continue diuresis with IV Lasix , monitor daily weight and I/Os, update echocardiogram, monitor renal function and electrolytes    2. CKD IV  - Renally-dose medications, monitor closely while diuresing    3. Hypertension  - Norvac, Coreg , ARB, hydrochlorothiazide, Aldactone     4. Hyperlipidemia  - Continue Zocor     5. Schizophrenia  - Continue Geodon     DVT prophylaxis: sq  heparin   Code Status: Full  Level of Care: Level of care: Telemetry Family Communication: Brother at bedside   Disposition Plan:  Patient is from: home  Anticipated d/c is to: TBD Anticipated d/c date is: 05/16/24  Patient currently: Pending echo, improved volume status, stable renal function  Consults called: none  Admission status: Inpatient     Evalene GORMAN Sprinkles, MD [facility]  05/13/2024, 9:44 PM

## 2024-05-13 NOTE — Plan of Care (Signed)

## 2024-05-13 NOTE — ED Provider Notes (Addendum)
 [facility] MEDICAL SURGICAL UNIT Provider Note  CSN: [id number] Arrival date & time: 05/13/24 1906  Chief Complaint(s) Leg Swelling  HPI Ellen Mcmillan is a 78 y.o. female history of hyperlipidemia, diabetes, hypertension, CKD, CHF, schizophrenia presenting with leg swelling.  Patient was seen earlier in the emergency department recently with leg swelling.  She was given a course of Lasix , reports she took this but did not have much improvement.  Follow-up with her primary doctor, primary doctor wanted patient to follow-up with nephrologist apparently although note not very detailed.  Patient denies shortness of breath but reports increased pain, trouble walking.  Reports that she drinks lots of fluids at night goes straight to her legs.  No chest pain.  No fevers or chills.  No rash.   Past Medical History Past Medical History:  Diagnosis Date   CKD (chronic kidney disease), stage IV (HCC) 05/13/2024   Diabetes mellitus without complication (HCC)    HLD (hyperlipidemia)    Hypertension    Ovarian cyst    Schizophrenia [facility])    Patient Active Problem List   Diagnosis Date Noted   Acute on chronic heart failure with preserved ejection fraction (HFpEF) (HCC) 05/13/2024   CKD (chronic kidney disease), stage IV (HCC) 05/13/2024   Gastroesophageal reflux disease without esophagitis 03/26/2024   Hyperlipidemia associated with type 2 diabetes mellitus (HCC) 03/26/2024   Hypertension 03/26/2024   Schizophrenia (HCC) 03/25/2024   Duodenal ulcer 12/05/2023   Constipation 12/05/2023   Pulmonary nodules 12/05/2023   Cough 12/05/2023   Abnormal CT scan, small bowel 09/19/2023   Upper abdominal pain 09/19/2023   Pain due to onychomycosis of toenails of both feet 10/28/2022   Home Medication(s) Prior to Admission medications   Medication Sig Start Date End Date Taking? Authorizing Provider  cetirizine (ZYRTEC) 10 MG tablet Take 10 mg by mouth at bedtime. 07/25/22  Yes [provider]  losartan  (COZAAR ) 100 MG tablet Take 1 tablet (100 mg total) by mouth daily. 05/15/24  Yes Emokpae, Courage, MD  simvastatin  (ZOCOR ) 20 MG tablet Take 1 tablet (20 mg total) by mouth daily. Patient taking differently: Take 20 mg by mouth daily at 6 PM. 03/25/24  Yes Bevely Doffing, FNP  ziprasidone  (GEODON ) 80 MG capsule Take 1 capsule (80 mg total) by mouth 2 (two) times daily with a meal. 03/25/24  Yes Bevely Doffing, FNP  amLODipine  (NORVASC ) 10 MG tablet Take 0.5 tablets (5 mg total) by mouth daily. For BP 05/15/24   Pearlean Manus, MD  carvedilol  (COREG ) 25 MG tablet Take 1 tablet (25 mg total) by mouth 2 (two) times daily with a meal. 05/15/24   Emokpae, Courage, MD  furosemide  (LASIX ) 20 MG tablet Take 1 tablet (20 mg total) by mouth daily. 05/16/24   Pearlean Manus, MD  pantoprazole  (PROTONIX ) 40 MG tablet Take 1 tablet (40 mg total) by mouth daily. 05/15/24 05/15/25  Pearlean Manus, MD  spironolactone  (ALDACTONE ) 25 MG tablet Take 1 tablet (25 mg total) by mouth daily. 05/15/24   Pearlean Manus, MD  Past Surgical History Past Surgical History:  Procedure Laterality Date   ABDOMINAL HYSTERECTOMY     BIOPSY  10/24/2023   Procedure: BIOPSY;  Surgeon: Cindie Carlin POUR, DO;  Location: [facility]O [facility];  Service: Endoscopy;;   ESOPHAGOGASTRODUODENOSCOPY (EGD) WITH PROPOFOL  N/A 10/24/2023   Procedure: ESOPHAGOGASTRODUODENOSCOPY (EGD) WITH PROPOFOL ;  Surgeon: Cindie Carlin POUR, DO;  Location: [facility];  Service: Endoscopy;  Laterality: N/A;  1:45 pm, asa 2   FRACTURE SURGERY Right    ankle   TUBAL LIGATION     Family History Family History  Problem Relation Age of Onset   Kidney failure Mother 9   Stroke Mother    Cancer Father        lung   Mental illness Sister    Mental illness Other    Cancer Other    Stroke Other     Social History Social  History   Tobacco Use   Smoking status: Never   Smokeless tobacco: Never   Tobacco comments:    Smoked when she was 27 and only did it for 3 months  Vaping Use   Vaping status: Never Used  Substance Use Topics   Alcohol  use: No   Drug use: No   Allergies Bee pollen  Review of Systems Review of Systems  All other systems reviewed and are negative.   Physical Exam Vital Signs  I have reviewed the triage vital signs BP (!) 160/50 (BP Location: Left Arm)   Pulse 71   Temp 97.7 F (36.5 C) (Oral)   Resp 20   Ht 5' 4 (1.626 m)   Wt 78.9 kg   SpO2 95%   BMI 29.86 kg/m  Physical Exam Vitals and nursing note reviewed.  Constitutional:      General: She is not in acute distress.    Appearance: She is well-developed.  HENT:     Head: Normocephalic and atraumatic.     Mouth/Throat:     Mouth: Mucous membranes are moist.  Eyes:     Pupils: Pupils are equal, round, and reactive to light.  Cardiovascular:     Rate and Rhythm: Normal rate and regular rhythm.     Heart sounds: No murmur heard. Pulmonary:     Effort: Pulmonary effort is normal. No respiratory distress.     Breath sounds: Normal breath sounds.  Abdominal:     General: Abdomen is flat.     Palpations: Abdomen is soft.     Tenderness: There is no abdominal tenderness.  Musculoskeletal:        General: No tenderness.     Right lower leg: Edema present.     Left lower leg: Edema present.  Skin:    General: Skin is warm and dry.  Neurological:     General: No focal deficit present.     Mental Status: She is alert. Mental status is at baseline.  Psychiatric:        Mood and Affect: Mood normal.        Behavior: Behavior normal.     ED Results and Treatments Labs (all labs ordered are listed, but only abnormal results are displayed) Labs Reviewed  COMPREHENSIVE METABOLIC PANEL WITH GFR - Abnormal; Notable for the following components:      Result Value   Sodium 133 (*)    CO2 18 (*)    Glucose,  Bld 125 (*)    BUN 25 (*)    Creatinine, Ser 2.16 (*)    GFR, Estimated  23 (*)    All other components within normal limits  CBC WITH DIFFERENTIAL/PLATELET - Abnormal; Notable for the following components:   RBC 2.84 (*)    Hemoglobin 9.3 (*)    HCT 28.8 (*)    MCV 101.4 (*)    All other components within normal limits  BRAIN NATRIURETIC PEPTIDE - Abnormal; Notable for the following components:   B Natriuretic Peptide 303.0 (*)    All other components within normal limits  BASIC METABOLIC PANEL WITH GFR - Abnormal; Notable for the following components:   Glucose, Bld 124 (*)    BUN 25 (*)    Creatinine, Ser 2.16 (*)    GFR, Estimated 23 (*)    All other components within normal limits  CBC - Abnormal; Notable for the following components:   RBC 2.83 (*)    Hemoglobin 9.2 (*)    HCT 28.7 (*)    MCV 101.4 (*)    All other components within normal limits  URIC ACID - Abnormal; Notable for the following components:   Uric Acid, Serum 7.8 (*)    All other components within normal limits  BASIC METABOLIC PANEL WITH GFR - Abnormal; Notable for the following components:   Glucose, Bld 118 (*)    BUN 27 (*)    Creatinine, Ser 2.30 (*)    GFR, Estimated 21 (*)    All other components within normal limits  CBC - Abnormal; Notable for the following components:   RBC 3.07 (*)    Hemoglobin 9.9 (*)    HCT 30.6 (*)    All other components within normal limits  MAGNESIUM                                                                                                                          Radiology No results found.   Pertinent labs & imaging results that were available during my care of the patient were reviewed by me and considered in my medical decision making (see MDM for details).  Medications Ordered in ED Medications  furosemide  (LASIX ) injection 40 mg (40 mg Intravenous Given 05/13/24 2142)  HYDROmorphone  (DILAUDID ) injection 0.5 mg (0.5 mg Intravenous Given 05/13/24 2201)  Procedures .Critical Care  Performed by: Francesca Elsie CROME, MD Authorized by: Francesca Elsie CROME, MD   Critical care provider statement:    Critical care time (minutes):  35   Critical care was necessary to treat or prevent imminent or life-threatening deterioration of the following conditions:  Cardiac failure and respiratory failure   Critical care was time spent personally by me on the following activities:  Development of treatment plan with patient or surrogate, discussions with consultants, evaluation of patient's response to treatment, examination of patient, ordering and review of laboratory studies, ordering and review of radiographic studies, ordering and performing treatments and interventions, pulse oximetry, re-evaluation of patient's condition and review of old charts   Care discussed with: admitting provider     (including critical care time)  Medical Decision Making / ED Course   MDM:  78 year old presenting to the emergency department with leg swelling.  Patient overall well-appearing, vital signs with no tachycardia, tachypnea, or hypoxia.  Physical examination with pitting edema bilaterally, approximately 2+.  I saw patient recently for similar, she was given outpatient prescription for Lasix , reports that this did not really help much.  She also saw her primary doctor who was not able to solve this problem.  She reports she is having worsening swelling and pain.  She does have history of CHF.  Chest x-ray shows possible pulmonary edema in the lungs clear on exam.  Her BNP is elevated from 2 weeks ago.  Given worsening symptoms will admit for diuresis.  Discussed with Dr. Charlton who accepted patient for admission.  Patient is agreeable to this.  She also requests an x-ray of her ankle, I have ordered this. Doubt other process e.g. DVT       Additional history obtained: -Additional history obtained from family -External records from outside source obtained and reviewed including: Chart review including previous notes, labs, imaging, consultation notes including prior ER visit and PMD visit    Lab Tests: -I ordered, reviewed, and interpreted labs.   The pertinent results include:   Labs Reviewed  COMPREHENSIVE METABOLIC PANEL WITH GFR - Abnormal; Notable for the following components:      Result Value   Sodium 133 (*)    CO2 18 (*)    Glucose, Bld 125 (*)    BUN 25 (*)    Creatinine, Ser 2.16 (*)    GFR, Estimated 23 (*)    All other components within normal limits  CBC WITH DIFFERENTIAL/PLATELET - Abnormal; Notable for the following components:   RBC 2.84 (*)    Hemoglobin 9.3 (*)    HCT 28.8 (*)    MCV 101.4 (*)    All other components within normal limits  BRAIN NATRIURETIC PEPTIDE - Abnormal; Notable for the following components:   B Natriuretic Peptide 303.0 (*)    All other components within normal limits  BASIC METABOLIC PANEL WITH GFR - Abnormal; Notable for the following components:   Glucose, Bld 124 (*)    BUN 25 (*)    Creatinine, Ser 2.16 (*)    GFR, Estimated 23 (*)    All other components within normal limits  CBC - Abnormal; Notable for the following components:   RBC 2.83 (*)    Hemoglobin 9.2 (*)    HCT 28.7 (*)    MCV 101.4 (*)    All other components within normal limits  URIC ACID - Abnormal; Notable for the following components:   Uric Acid, Serum 7.8 (*)    All  other components within normal limits  BASIC METABOLIC PANEL WITH GFR - Abnormal; Notable for the following components:   Glucose, Bld 118 (*)    BUN 27 (*)    Creatinine, Ser 2.30 (*)    GFR, Estimated 21 (*)    All other components within normal limits  CBC - Abnormal; Notable for the following components:   RBC 3.07 (*)    Hemoglobin 9.9 (*)    HCT 30.6 (*)    All other components within normal limits  MAGNESIUM     Notable for elevated BNP   Imaging Studies ordered: I ordered imaging studies including CXR On my interpretation imaging demonstrates ?pulm edema  I independently visualized and interpreted imaging. I agree with the radiologist interpretation   Medicines ordered and prescription drug management: Meds ordered this encounter  Medications   furosemide  (LASIX ) injection 40 mg   DISCONTD: amLODipine  (NORVASC ) tablet 10 mg   DISCONTD: carvedilol  (COREG ) tablet 12.5 mg   DISCONTD: simvastatin  (ZOCOR ) tablet 20 mg   DISCONTD: spironolactone  (ALDACTONE ) tablet 25 mg   DISCONTD: ziprasidone  (GEODON ) capsule 80 mg   DISCONTD: pantoprazole  (PROTONIX ) EC tablet 40 mg   DISCONTD: heparin  injection 5,000 Units   DISCONTD: furosemide  (LASIX ) injection 40 mg   DISCONTD: sodium chloride  flush (NS) 0.9 % injection 3 mL   DISCONTD: acetaminophen  (TYLENOL ) tablet 650 mg   DISCONTD: acetaminophen  (TYLENOL ) suppository 650 mg   DISCONTD: senna (SENOKOT) tablet 8.6 mg   DISCONTD: prochlorperazine  (COMPAZINE ) injection 5 mg   HYDROmorphone  (DILAUDID ) injection 0.5 mg   DISCONTD: amLODipine  (NORVASC ) tablet 5 mg   DISCONTD: Oral care mouth rinse   DISCONTD: ziprasidone  (GEODON ) capsule 80 mg   DISCONTD: rosuvastatin  (CRESTOR ) tablet 10 mg   DISCONTD: carvedilol  (COREG ) tablet 25 mg   DISCONTD: furosemide  (LASIX ) tablet 20 mg   amLODipine  (NORVASC ) 10 MG tablet    Sig: Take 0.5 tablets (5 mg total) by mouth daily. For BP    Dispense:  45 tablet    Refill:  5   carvedilol  (COREG ) 25 MG tablet    Sig: Take 1 tablet (25 mg total) by mouth 2 (two) times daily with a meal.    Dispense:  60 tablet    Refill:  5   losartan  (COZAAR ) 100 MG tablet    Sig: Take 1 tablet (100 mg total) by mouth daily.    Dispense:  30 tablet    Refill:  5   pantoprazole  (PROTONIX ) 40 MG tablet    Sig: Take 1 tablet (40 mg total) by mouth daily.    Dispense:  30 tablet    Refill:  11   spironolactone  (ALDACTONE ) 25 MG  tablet    Sig: Take 1 tablet (25 mg total) by mouth daily.    Dispense:  30 tablet    Refill:  5   furosemide  (LASIX ) 20 MG tablet    Sig: Take 1 tablet (20 mg total) by mouth daily.    Dispense:  30 tablet    Refill:  5    -I have reviewed the patients home medicines and have made adjustments as needed   Consultations Obtained: I requested consultation with the hospitalist,  and discussed lab and imaging findings as well as pertinent plan - they recommend: admission   Cardiac Monitoring: The patient was maintained on a cardiac monitor.  I personally viewed and interpreted the cardiac monitored which showed an underlying rhythm of: NSR  Social Determinants of Health:  Diagnosis or treatment significantly limited  by social determinants of health: obesity and lives alone   Reevaluation: After the interventions noted above, I reevaluated the patient and found that their symptoms have improved  Co morbidities that complicate the patient evaluation  Past Medical History:  Diagnosis Date   CKD (chronic kidney disease), stage IV (HCC) 05/13/2024   Diabetes mellitus without complication (HCC)    HLD (hyperlipidemia)    Hypertension    Ovarian cyst    Schizophrenia (HCC)       Dispostion: Disposition decision including need for hospitalization was considered, and patient admitted to the hospital.    Final Clinical Impression(s) / ED Diagnoses Final diagnoses:  Congestive heart failure, unspecified HF chronicity, unspecified heart failure type (HCC)     This chart was dictated using voice recognition software.  Despite best efforts to proofread,  errors can occur which can change the documentation meaning.    Francesca Elsie CROME, MD 05/13/24 2152    Francesca Elsie CROME, MD 05/27/24 [phone]

## 2024-05-14 ENCOUNTER — Inpatient Hospital Stay (HOSPITAL_COMMUNITY)

## 2024-05-14 DIAGNOSIS — I10 Essential (primary) hypertension: Secondary | ICD-10-CM | POA: Diagnosis not present

## 2024-05-14 DIAGNOSIS — M79606 Pain in leg, unspecified: Secondary | ICD-10-CM | POA: Diagnosis not present

## 2024-05-14 DIAGNOSIS — E785 Hyperlipidemia, unspecified: Secondary | ICD-10-CM | POA: Diagnosis not present

## 2024-05-14 DIAGNOSIS — I5033 Acute on chronic diastolic (congestive) heart failure: Secondary | ICD-10-CM | POA: Diagnosis not present

## 2024-05-14 DIAGNOSIS — I5031 Acute diastolic (congestive) heart failure: Secondary | ICD-10-CM | POA: Diagnosis not present

## 2024-05-14 LAB — BASIC METABOLIC PANEL WITH GFR
Anion gap: 9 (ref 5–15)
BUN: 25 mg/dL — ABNORMAL HIGH (ref 8–23)
CO2: 22 mmol/L (ref 22–32)
Calcium: 9 mg/dL (ref 8.9–10.3)
Chloride: 108 mmol/L (ref 98–111)
Creatinine, Ser: 2.16 mg/dL — ABNORMAL HIGH (ref 0.44–1.00)
GFR, Estimated: 23 mL/min — ABNORMAL LOW (ref >60)
Glucose, Bld: 124 mg/dL — ABNORMAL HIGH (ref 70–99)
Potassium: 4.9 mmol/L (ref 3.5–5.1)
Sodium: 139 mmol/L (ref 135–145)

## 2024-05-14 LAB — URIC ACID: Uric Acid, Serum: 7.8 mg/dL — ABNORMAL HIGH (ref 2.5–7.1)

## 2024-05-14 LAB — ECHOCARDIOGRAM COMPLETE
Area-P 1/2: 3.75 cm2
Calc EF: 76.7 %
Est EF: >75
Height: 64 in
S' Lateral: 2.6 cm
Single Plane A2C EF: 77.3 %
Single Plane A4C EF: 76.1 %
Weight: 2938.29 [oz_av]

## 2024-05-14 LAB — CBC
HCT: 28.7 % — ABNORMAL LOW (ref 36.0–46.0)
Hemoglobin: 9.2 g/dL — ABNORMAL LOW (ref 12.0–15.0)
MCH: 32.5 pg (ref 26.0–34.0)
MCHC: 32.1 g/dL (ref 30.0–36.0)
MCV: 101.4 fL — ABNORMAL HIGH (ref 80.0–100.0)
Platelets: 257 K/uL (ref 150–400)
RBC: 2.83 MIL/uL — ABNORMAL LOW (ref 3.87–5.11)
RDW: 12.4 % (ref 11.5–15.5)
WBC: 7.8 K/uL (ref 4.0–10.5)
nRBC: 0 % (ref 0.0–0.2)

## 2024-05-14 LAB — MAGNESIUM: Magnesium: 2 mg/dL (ref 1.7–2.4)

## 2024-05-14 MED ORDER — ROSUVASTATIN CALCIUM 10 MG PO TABS
10.0000 mg | ORAL_TABLET | Freq: Every day | ORAL | Status: DC
  Administered 2024-05-15: 10 mg via ORAL
  Filled 2024-05-14: qty 1

## 2024-05-14 MED ORDER — ZIPRASIDONE HCL 40 MG PO CAPS
80.0000 mg | ORAL_CAPSULE | Freq: Two times a day (BID) | ORAL | Status: DC
  Administered 2024-05-14 – 2024-05-15 (×3): 80 mg via ORAL
  Filled 2024-05-14 (×5): qty 2

## 2024-05-14 MED ORDER — ORAL CARE MOUTH RINSE: 15.0000 mL | OROMUCOSAL | Status: DC | PRN

## 2024-05-14 MED ORDER — AMLODIPINE BESYLATE 5 MG PO TABS
5.0000 mg | ORAL_TABLET | Freq: Every day | ORAL | Status: DC
  Administered 2024-05-15: 5 mg via ORAL
  Filled 2024-05-14: qty 1

## 2024-05-14 NOTE — Progress Notes (Signed)
  Echocardiogram 2D Echocardiogram has been performed.  Ellen Mcmillan 05/14/2024, 2:34 PM

## 2024-05-14 NOTE — Consult Note (Addendum)
 Cardiology Consultation   Patient ID: Ellen Mcmillan MRN: [id number]; DOB: 1946/03/30  Admit date: 05/13/2024 Date of Consult: 05/14/2024  PCP:  Bevely Doffing, FNP   [facility] Providers Cardiologist:  Vinie JAYSON Maxcy, MD        Patient Profile: Ellen Mcmillan is a 78 y.o. female with a hx of HTN, Type 2 DM, HLD, Stage 4 CKD and schizophrenia who is being seen 05/14/2024 for the evaluation of acute CHF at the request of Dr. Bryn.  History of Present Illness: [name] presented to [facility] on 05/13/2024 for evaluation of worsening lower extremity edema. She reported that her PCP had stopped Aldactone  in 02/2024 and she developed worsening lower extremity edema after this. Had since been restarted on the medication and she had previously been given a 5-day course of Lasix  as well.   Initial labs showed WBC 7.6, Hgb 9.3 (baseline 10-11), platelets 253, Na+ 133, K+ 4.9 and creatinine 2.16 (baseline creatinine 2.0 - 2.2). BNP 303. CXR showed mild interstitial coarsening which may represent mild pulmonary edema.  EKG showed NSR, HR 65 with TWI along lateral leads which has been noted on prior tracings.   She received IV Lasix  40 mg while in the ED and has been started on 40 mg twice daily. She has been continued on Amlodipine  10 mg daily, Coreg  12.5 mg twice daily and Spironolactone  25 mg daily. Was on Losartan -HCTZ 100-25 mg daily prior to admission and this has been held. Repeat labs today show sodium at 139, K+ at 4.9 and creatinine 2.16. Recorded net output of -1.2 L thus far. ReDS vest reading obtained this morning and normal at 19.  In talking with the patient today, her biggest complaint is pain along her legs bilaterally. Says she has been taking ibuprofen routinely to help with this. Says that she can barely put weight down on both legs but pain is typically worse along her right foot/ankle. She does report some shortness of breath with activity but feels like this is  due to her pain.  No specific orthopnea or PND. No recent exertional chest pain. She is unsure of her exact medications but it sounds like she has been on Losartan -HCTZ at home and was only taking Lasix  intermittently as discussed above. Unsure of her baseline weight but it is at 183 lbs today. Says that she does try to limit her sodium intake but has not noticed any improvement in symptoms while doing so.   Past Medical History:  Diagnosis Date   CKD (chronic kidney disease), stage IV (HCC) 05/13/2024   Diabetes mellitus without complication (HCC)    HLD (hyperlipidemia)    Hypertension    Ovarian cyst    Schizophrenia [facility])     Past Surgical History:  Procedure Laterality Date   ABDOMINAL HYSTERECTOMY     BIOPSY  10/24/2023   Procedure: BIOPSY;  Surgeon: Cindie Carlin POUR, DO;  Location: [facility];  Service: Endoscopy;;   ESOPHAGOGASTRODUODENOSCOPY (EGD) WITH PROPOFOL  N/A 10/24/2023   Procedure: ESOPHAGOGASTRODUODENOSCOPY (EGD) WITH PROPOFOL ;  Surgeon: Cindie Carlin POUR, DO;  Location: [facility];  Service: Endoscopy;  Laterality: N/A;  1:45 pm, asa 2   FRACTURE SURGERY Right    ankle   TUBAL LIGATION       Home Medications:  Prior to Admission medications   Medication Sig Start Date End Date Taking? Authorizing Provider  amLODipine  (NORVASC ) 10 MG tablet Take 1 tablet (10 mg total) by mouth daily. 03/25/24  Yes Bevely Doffing, FNP  amoxicillin (AMOXIL) 500 MG capsule Take 500 mg by mouth daily. 04/27/24  Yes [provider]  carvedilol  (COREG ) 12.5 MG tablet Take 1 tablet (12.5 mg total) by mouth 2 (two) times daily with a meal. 03/25/24  Yes Huenink, Doffing, FNP  cetirizine (ZYRTEC) 10 MG tablet Take 10 mg by mouth at bedtime. 07/25/22  Yes [provider]  ibuprofen (ADVIL) 200 MG tablet Take 400 mg by mouth every 6 (six) hours as needed for mild pain (pain score 1-3). 04/27/24  Yes [provider]  losartan -hydrochlorothiazide (HYZAAR) 100-25 MG tablet  Take 1 tablet by mouth daily. 03/25/24  Yes Bevely Doffing, FNP  Multiple Vitamin (MULTIVITAMIN) tablet Take 1 tablet by mouth daily.   Yes [provider]  Multiple Vitamins-Minerals (HAIR SKIN & NAILS PO) Take 1 tablet by mouth daily.   Yes [provider]  pantoprazole  (PROTONIX ) 40 MG tablet Take 1 tablet (40 mg total) by mouth 2 (two) times daily. 03/25/24 03/25/25 Yes Bevely Doffing, FNP  simvastatin  (ZOCOR ) 20 MG tablet Take 1 tablet (20 mg total) by mouth daily. Patient taking differently: Take 20 mg by mouth daily at 6 PM. 03/25/24  Yes Huenink, Doffing, FNP  spironolactone  (ALDACTONE ) 25 MG tablet Take 1 tablet (25 mg total) by mouth daily. 05/08/24  Yes Bevely Doffing, FNP  ziprasidone  (GEODON ) 80 MG capsule Take 1 capsule (80 mg total) by mouth 2 (two) times daily with a meal. 03/25/24  Yes Bevely Doffing, FNP    Scheduled Meds:  amLODipine   10 mg Oral Daily   carvedilol   12.5 mg Oral BID WC   furosemide   40 mg Intravenous Q12H   heparin   5,000 Units Subcutaneous Q8H   pantoprazole   40 mg Oral BID   simvastatin   20 mg Oral Daily   sodium chloride  flush  3 mL Intravenous Q12H   spironolactone   25 mg Oral Daily   ziprasidone   80 mg Oral BID WC   Continuous Infusions:  PRN Meds: acetaminophen  **OR** acetaminophen , prochlorperazine , senna  Allergies:    Allergies  Allergen Reactions   Bee Pollen Other (See Comments)    Seasonal allergies    Social History:   Social History   Socioeconomic History   Marital status: Divorced    Spouse name: Not on file   Number of children: Not on file   Years of education: Not on file   Highest education level: Not on file  Occupational History   Not on file  Tobacco Use   Smoking status: Never   Smokeless tobacco: Never   Tobacco comments:    Smoked when she was 27 and only did it for 3 months  Vaping Use   Vaping status: Never Used  Substance and Sexual Activity   Alcohol  use: No   Drug use: No   Sexual activity:  Not on file  Other Topics Concern   Not on file  Social History Narrative   Not on file   Social Drivers of Health   Financial Resource Strain: Not on file  Food Insecurity: No Food Insecurity (05/14/2024)   Hunger Vital Sign    Worried About Running Out of Food in the Last Year: Never true    Ran Out of Food in the Last Year: Never true  Transportation Needs: No Transportation Needs (05/14/2024)   PRAPARE - Administrator, Civil Service (Medical): No    Lack of Transportation (Non-Medical): No  Physical Activity: Not on file  Stress: Not on file  Social Connections: Socially Isolated (05/14/2024)   Social Connection and Isolation Panel    Frequency of Communication with Friends and Family: Three times a week    Frequency of Social Gatherings with Friends and Family: Twice a week    Attends Religious Services: Never    Database administrator or Organizations: No    Attends Banker Meetings: Never    Marital Status: Never married  Intimate Partner Violence: Not At Risk (05/14/2024)   Humiliation, Afraid, Rape, and Kick questionnaire    Fear of Current or Ex-Partner: No    Emotionally Abused: No    Physically Abused: No    Sexually Abused: No    Family History:    Family History  Problem Relation Age of Onset   Kidney failure Mother 5   Stroke Mother    Cancer Father        lung   Mental illness Sister    Mental illness Other    Cancer Other    Stroke Other      ROS:  Please see the history of present illness.   All other ROS reviewed and negative.     Physical Exam/Data: Vitals:   05/13/24 2311 05/13/24 2336 05/14/24 0423 05/14/24 0500  BP: (!) 153/63 (!) 153/63 (!) 166/50   Pulse: 64 64 77   Resp: 18 18 18    Temp: 98.1 F (36.7 C) 98.1 F (36.7 C) 98.4 F (36.9 C)   TempSrc:  Oral    SpO2: 95%  93%   Weight: 83.5 kg 83.5 kg  83.3 kg  Height:  5' 4 (1.626 m)      Intake/Output Summary (Last 24 hours) at 05/14/2024 0821 Last  data filed at 05/14/2024 0500 Gross per 24 hour  Intake 360 ml  Output 1600 ml  Net -1240 ml      05/14/2024    5:00 AM 05/13/2024   11:36 PM 05/13/2024   11:11 PM  Last 3 Weights  Weight (lbs) 183 lb 10.3 oz 184 lb 1.4 oz 184 lb 1.4 oz  Weight (kg) 83.3 kg 83.5 kg 83.5 kg     Body mass index is 31.52 kg/m.  General:  Well nourished, well developed female appearing in no acute distress HEENT: normal Neck: no JVD Vascular: No carotid bruits; Distal pulses 2+ bilaterally Cardiac:  normal S1, S2; RRR; no murmur  Lungs:  clear to auscultation bilaterally, no wheezing, rhonchi or rales  Abd: soft, nontender, no hepatomegaly  Ext: 1+ pitting edema bilaterally. Diffusely tender to touch along both legs bilaterally. Skin: warm and dry  Neuro:  CNs 2-12 intact, no focal abnormalities noted Psych:  Normal affect   EKG:  The EKG was personally reviewed and demonstrates: NSR, HR 65 with TWI along lateral leads which has been noted on prior tracings. Telemetry:  Telemetry was personally reviewed and demonstrates: NSR, HR in 60's to 70's.  Relevant CV Studies:  Echocardiogram: 04/2022 IMPRESSIONS     1. Left ventricular ejection fraction, by estimation, is 60 to 65%. The  left ventricle has normal function. The left ventricle has no regional  wall motion abnormalities. There is mild concentric left ventricular  hypertrophy. Left ventricular diastolic  parameters are indeterminate.   2. Right ventricular systolic function is normal. The right ventricular  size is normal. Tricuspid regurgitation signal is inadequate for assessing  PA pressure.   3. The mitral valve is grossly normal. Trivial mitral valve  regurgitation. No evidence  of mitral stenosis.   4. The aortic valve is tricuspid. There is mild calcification of the  aortic valve. There is mild thickening of the aortic valve. Aortic valve  regurgitation is trivial.   5. The inferior vena cava is normal in size with greater than  50%  respiratory variability, suggesting right atrial pressure of 3 mmHg.    Laboratory Data: High Sensitivity Troponin:  No results for input(s): TROPONINIHS in the last 720 hours.   Chemistry Recent Labs  Lab 05/13/24 1955 05/14/24 0426  NA 133* 139  K 4.9 4.9  CL 103 108  CO2 18* 22  GLUCOSE 125* 124*  BUN 25* 25*  CREATININE 2.16* 2.16*  CALCIUM  8.9 9.0  MG  --  2.0  GFRNONAA 23* 23*  ANIONGAP 12 9    Recent Labs  Lab 05/13/24 1955  PROT 7.4  ALBUMIN 3.6  AST 25  ALT 26  ALKPHOS 85  BILITOT 0.5   Lipids No results for input(s): CHOL, TRIG, HDL, LABVLDL, LDLCALC, CHOLHDL in the last 168 hours.  Hematology Recent Labs  Lab 05/13/24 1955 05/14/24 0426  WBC 7.6 7.8  RBC 2.84* 2.83*  HGB 9.3* 9.2*  HCT 28.8* 28.7*  MCV 101.4* 101.4*  MCH 32.7 32.5  MCHC 32.3 32.1  RDW 12.4 12.4  PLT 253 257   Thyroid  No results for input(s): TSH, FREET4 in the last 168 hours.  BNP Recent Labs  Lab 05/13/24 1955  BNP 303.0*    DDimer No results for input(s): DDIMER in the last 168 hours.  Radiology/Studies:  DG Ankle Complete Right Result Date: 05/13/2024 CLINICAL DATA:  ankle pain EXAM: RIGHT ANKLE - COMPLETE 3+ VIEW COMPARISON:  X-ray right ankle 03/26/2020 FINDINGS: Screw fixation of the distal fibula. Screw fixation of the medial malleolus as well as K-wire. Vague linear lucency through an old healed posterior malleolar fracture. There is no evidence of fracture, dislocation, or joint effusion. There is no evidence of arthropathy or other focal bone abnormality. Diffuse ankle and foot subcutaneus soft tissue edema. IMPRESSION: 1. No acute displaced fracture or dislocation. 2. Vague linear lucency through an old healed posterior malleolar fracture. Correlate with point tenderness to palpation to evaluate for acute nondisplaced fracture 3. Screw fixation of the distal fibula. Screw fixation of the medial malleolus as well as K-wire. Electronically Signed    By: Morgane  Naveau M.D.   On: 05/13/2024 22:15   DG Chest Portable 1 View Result Date: 05/13/2024 CLINICAL DATA:  Chest pain.  Bilateral leg swelling. EXAM: PORTABLE CHEST 1 VIEW COMPARISON:  04/24/2024 FINDINGS: Stable heart size and mediastinal contours. Mild interstitial coarsening. No confluent airspace disease. Subsegmental atelectasis/scarring at the lung bases. No pleural effusion. On limited assessment, no acute osseous findings. IMPRESSION: Mild interstitial coarsening, may represent mild pulmonary edema. Electronically Signed   By: Andrea Gasman M.D.   On: 05/13/2024 20:48     Assessment and Plan:  1. Acute HFpEF/Lower Extremity Edema - Presented with worsening lower extremity edema and BNP mildly elevated at 303. She has been started on IV Lasix  with a recorded net output of -1.2 L thus far. ReDS vest reading normal at 19 this morning. - Repeat echocardiogram is pending for later today. Can continue with IV Lasix  40 mg twice daily for now but would be cautious given her renal function. She was on HCTZ prior to admission along with intermittent doses of Lasix . Would stop HCTZ for now. Will also reduce Amlodipine  to 5 mg daily as she previously  experienced worsening lower extremity edema with 10 mg daily in the past as well.  2. Bilateral Leg Pain - Imaging of her right ankle on admission showed no acute displaced fracture or dislocation but was noted to have a vague linear lucency along an old healed malleolar fracture and was recommended to evaluate for acute nondisplaced fracture. She is diffusely tender to palpation along both legs on examination. Will check a uric acid level. Advised against using NSAIDs given her renal function. Further management per the admitting team.  3. HTN - BP was elevated at 166/50 this morning. She has been continued on Coreg  12.5 mg twice daily, Spironolactone  25 mg daily and Amlodipine  10 mg daily. Will reduce Amlodipine  to 5 mg daily given her  worsening lower extremity edema with this in the past. Losartan  currently held given her renal function. Would hold off on HCTZ for now given the use of Lasix . Can further titrate Coreg  if needed for additional BP control.  4. HLD - Followed by her PCP and LDL was at 103 when checked in 03/2024. She has been continued on Simvastatin  20 mg daily. Would consider switching to Crestor  to avoid drug interactions.  5. Stage 4 CKD - Baseline creatinine 2.0 - 2.2. At 2.16 on admission and stable at this today. She has been taking lots of NSAIDs at home as discussed above and we reviewed this is not recommended given her CKD. Advised using Tylenol  if needed. - Would stop HCTZ for now given the use of Lasix .  For questions or updates, please contact [facility] Please consult [email or web address] for contact info under    Signed, Laymon CHRISTELLA Qua, PA-C  05/14/2024 8:21 AM  Pt seen and examined   I agree with findings as noted by B Strader above  PT is a 78 yo with hx of HTN, HL, T2DM, CKD   Presented to [facility] with increased LE edema   Note aldactone  stopped in May 2025  Just restarted and given 5 day course lasix  just prior to admit   ON admission, BNP 303   CXR with interstitial coarsening.  Treated with IV lasix     ReDS vest this am 19  Currently pt is breathing OK   Denies CP   Complains of pain in legs  BP 140s to 170s    166/50 this AM  Neck:  No JVD Lungs are CTA Cardiac RRR   No S3   Ext 1+ LE  edema  Tender to palpation bilaterally \\  HFpEF  Pt presented with mild exacerbation   Diuresing  ReDs reading normal   Still with some mild edema    Will review echo    Can continue IV lasix  today only  As noted by B Strader would cut back amlodipine  to 5 mg    Follow   HTN  BP is elevated this am   If remains high would increase carvedilol       REnal  Cr 2.16 today   Follow   HL   WOuld reocmm switching to 10 Crestor  for tighter control of LDL     Vina Gull MD

## 2024-05-14 NOTE — Plan of Care (Signed)

## 2024-05-14 NOTE — TOC CM/SW Note (Signed)
 Transition of Care [facility]) - Inpatient Brief Assessment   Patient Details  Name: Ellen Mcmillan MRN: [id number] Date of Birth: 08-15-1946  Transition of Care [name]) CM/SW Contact:    Noreen KATHEE Pinal, LCSWA Phone Number: 05/14/2024, 11:30 AM   Clinical Narrative:  CSW spoke with patient at bedside regarding consult for Heart Failure. Patient shared that she is followed by a heart doctor, eats all day, takes all her prescribed medications and does not weigh herself daily. CSW asked patient if she would like a scale to take home so she can start weighing herself and patient was agreeable. Scale was provided. Patient did shared that she has transportation through her insurance and sometime she brother. Patient does not use any equipment, states that she is independent. Heart Failure information was added to chart. TOC to follow.   TOC will continue to monitor patient advancement through interdisciplinary progression rounds. If new patient transition needs arise, please place a TOC consult.   Transition of Care Asessment: Insurance and Status: Insurance coverage has been reviewed Patient has primary care physician: Yes Home environment has been reviewed: Single Family Home Prior level of function:: Independent Prior/Current Home Services: No current home services Social Drivers of Health Review: SDOH reviewed no interventions necessary Readmission risk has been reviewed: Yes Transition of care needs: transition of care needs identified, TOC will continue to follow

## 2024-05-14 NOTE — Progress Notes (Signed)
 [facility] PROGRESS NOTE  Ellen Mcmillan (DOB: 1946/03/31) FMW:[id number] PCP: Bevely Doffing, FNP  Brief Narrative: Ellen Mcmillan is a 78 y.o. female with a history of HTN, HLD, stage IV CKD, and schizophrenia who presented to the ED on 05/13/2024 with leg swelling. She appeared hypervolemic on exam and CXR revealed interstitial edema consistent with acute on chronic HFpEF for which IV lasix  was started and cardiology was consulted with echocardiogram pending.  Subjective: Having pain in her legs which is chronic for which she takes ibuprofen very often. Has no shortness of breath, swellingin her legs is reportedly nearly unchanged.   Objective: BP (!) 166/50   Pulse 77   Temp 98.4 F (36.9 C)   Resp 18   Ht 5' 4 (1.626 m)   Wt 83.3 kg   SpO2 93%   BMI 31.52 kg/m   Gen: No distress Pulm: Clear, nonlabored  CV: RRR, no MRG, 1-2+ pitting LE edema symmetrically.  GI: Soft, NT, ND, +BS  Neuro: Alert and oriented. No new focal deficits. Ext: Warm, no deformities. Skin: No rashes, lesions or ulcers on visualized skin   Assessment & Plan: Acute on chronic HFpEF: LVEF was 60-65% in July 2023.  - Continue lasix  40mg  IV BID, monitor I/O (1,678ml UOP on day of admission), weights and ReDS vest (reassuring reading today).  - Updating echo (pending) - LE edema may be multifactorial, decreasing norvasc , recommend elevation/compression, and weight loss as well.     Stage IV CKD: Cr stable since admission with diuresis.  - Repeat BMP in AM - Avoid nephrotoxins.   HTN:  - Continue coreg , ARB, HCTZ, MRA, and decreasing norvasc .   HLD: Last LDL was 103 on simvastatin .   - Rosuvastatin  10mg  started per cardiology, this is ceiling dose for her CrCl.    Schizophrenia: - Continue geodon    Bernardino KATHEE Come, MD [facility] [email or web address] 05/14/2024, 3:01 PM

## 2024-05-14 NOTE — Progress Notes (Signed)
   05/14/24 0400  ReDS Vest / Clip  Station Marker B  Ruler Value 48  ReDS Value Range < 36  ReDS Actual Value 19

## 2024-05-15 ENCOUNTER — Inpatient Hospital Stay: Payer: Self-pay

## 2024-05-15 DIAGNOSIS — I5031 Acute diastolic (congestive) heart failure: Secondary | ICD-10-CM | POA: Diagnosis not present

## 2024-05-15 DIAGNOSIS — I5033 Acute on chronic diastolic (congestive) heart failure: Secondary | ICD-10-CM | POA: Diagnosis not present

## 2024-05-15 DIAGNOSIS — I10 Essential (primary) hypertension: Secondary | ICD-10-CM | POA: Diagnosis not present

## 2024-05-15 DIAGNOSIS — M79606 Pain in leg, unspecified: Secondary | ICD-10-CM | POA: Diagnosis not present

## 2024-05-15 DIAGNOSIS — E785 Hyperlipidemia, unspecified: Secondary | ICD-10-CM | POA: Diagnosis not present

## 2024-05-15 LAB — CBC
HCT: 30.6 % — ABNORMAL LOW (ref 36.0–46.0)
Hemoglobin: 9.9 g/dL — ABNORMAL LOW (ref 12.0–15.0)
MCH: 32.2 pg (ref 26.0–34.0)
MCHC: 32.4 g/dL (ref 30.0–36.0)
MCV: 99.7 fL (ref 80.0–100.0)
Platelets: 282 K/uL (ref 150–400)
RBC: 3.07 MIL/uL — ABNORMAL LOW (ref 3.87–5.11)
RDW: 12.3 % (ref 11.5–15.5)
WBC: 7.2 K/uL (ref 4.0–10.5)
nRBC: 0 % (ref 0.0–0.2)

## 2024-05-15 LAB — BASIC METABOLIC PANEL WITH GFR
Anion gap: 12 (ref 5–15)
BUN: 27 mg/dL — ABNORMAL HIGH (ref 8–23)
CO2: 27 mmol/L (ref 22–32)
Calcium: 9.2 mg/dL (ref 8.9–10.3)
Chloride: 100 mmol/L (ref 98–111)
Creatinine, Ser: 2.3 mg/dL — ABNORMAL HIGH (ref 0.44–1.00)
GFR, Estimated: 21 mL/min — ABNORMAL LOW (ref >60)
Glucose, Bld: 118 mg/dL — ABNORMAL HIGH (ref 70–99)
Potassium: 4.3 mmol/L (ref 3.5–5.1)
Sodium: 139 mmol/L (ref 135–145)

## 2024-05-15 MED ORDER — FUROSEMIDE 20 MG PO TABS: 20.0000 mg | ORAL_TABLET | Freq: Every day | ORAL | 5 refills | Status: DC

## 2024-05-15 MED ORDER — AMLODIPINE BESYLATE 10 MG PO TABS: 5.0000 mg | ORAL_TABLET | Freq: Every day | ORAL | 5 refills | Status: DC

## 2024-05-15 MED ORDER — PANTOPRAZOLE SODIUM 40 MG PO TBEC: 40.0000 mg | DELAYED_RELEASE_TABLET | Freq: Every day | ORAL | 11 refills | Status: AC

## 2024-05-15 MED ORDER — LOSARTAN POTASSIUM 100 MG PO TABS: 100.0000 mg | ORAL_TABLET | Freq: Every day | ORAL | 5 refills | Status: DC

## 2024-05-15 MED ORDER — CARVEDILOL 25 MG PO TABS: 25.0000 mg | ORAL_TABLET | Freq: Two times a day (BID) | ORAL | 5 refills | Status: DC

## 2024-05-15 MED ORDER — CARVEDILOL 12.5 MG PO TABS: 25.0000 mg | ORAL_TABLET | Freq: Two times a day (BID) | ORAL | Status: DC

## 2024-05-15 MED ORDER — FUROSEMIDE 20 MG PO TABS: 20.0000 mg | ORAL_TABLET | Freq: Every day | ORAL | Status: DC

## 2024-05-15 MED ORDER — SPIRONOLACTONE 25 MG PO TABS: 25.0000 mg | ORAL_TABLET | Freq: Every day | ORAL | 5 refills | Status: DC

## 2024-05-15 NOTE — Discharge Summary (Signed)
 Ellen Mcmillan, is a 78 y.o. female  DOB [birth year]  MRN [id number].  Admission date:  05/13/2024  Admitting Physician  Evalene GORMAN Sprinkles, MD  Discharge Date:  05/15/2024   Primary MD  Bevely Doffing, FNP  Recommendations for primary care physician for things to follow:  1)Very Low-salt diet advised---Less than 2 gm of Sodium per day advised----ok to use Mrs DASH salt substitute instead of Salt 2)Weigh yourself daily, call if you gain more than 3 pounds in 1 day or more than 5 pounds in 1 week as your diuretic medications may need to be adjusted 3)Stop Hydrochlorothiazide (HCTZ)--- 4)Start Lasix  (Furosemide ) 20 mg daily 5)Repeat BMP and CBC blood test in 1 week with your primary care physician Bennett, Doffing, FNP) 6)Avoid ibuprofen/Advil/Aleve/Motrin/Goody Powders/Naproxen/BC powders/Meloxicam/Diclofenac/Indomethacin and other Nonsteroidal anti-inflammatory medications as these will make you more likely to bleed and can cause stomach ulcers, can also cause Kidney problems. 7) decrease amlodipine /Norvasc  to 5 mg daily 8) increase Coreg  to 25 mg twice daily 9) okay to use losartan  100 mg daily (without hCTZ (Hydrochlorothiazide)  Admission Diagnosis  Congestive heart failure, unspecified HF chronicity, unspecified heart failure type (HCC) [I50.9] Acute on chronic heart failure with preserved ejection fraction (HFpEF) (HCC) [I50.33]   Discharge Diagnosis  Congestive heart failure, unspecified HF chronicity, unspecified heart failure type (HCC) [I50.9] Acute on chronic heart failure with preserved ejection fraction (HFpEF) (HCC) [I50.33]    Principal Problem:   Acute on chronic heart failure with preserved ejection fraction (HFpEF) (HCC) Active Problems:   Schizophrenia (HCC)   Hyperlipidemia associated with type 2 diabetes mellitus (HCC)   Hypertension   CKD (chronic kidney disease), stage IV (HCC)      Past  Medical History:  Diagnosis Date   CKD (chronic kidney disease), stage IV (HCC) 05/13/2024   Diabetes mellitus without complication (HCC)    HLD (hyperlipidemia)    Hypertension    Ovarian cyst    Schizophrenia (HCC)     Past Surgical History:  Procedure Laterality Date   ABDOMINAL HYSTERECTOMY     BIOPSY  10/24/2023   Procedure: BIOPSY;  Surgeon: Cindie Carlin POUR, DO;  Location: [facility];  Service: Endoscopy;;   ESOPHAGOGASTRODUODENOSCOPY (EGD) WITH PROPOFOL  N/A 10/24/2023   Procedure: ESOPHAGOGASTRODUODENOSCOPY (EGD) WITH PROPOFOL ;  Surgeon: Cindie Carlin POUR, DO;  Location: [facility];  Service: Endoscopy;  Laterality: N/A;  1:45 pm, asa 2   FRACTURE SURGERY Right    ankle   TUBAL LIGATION       HPI  from the history and physical done on the day of admission:  Patient coming from: Home    Chief Complaint: leg swelling    HPI: Ellen Mcmillan is a 78 y.o. female with medical history significant for hypertension, hyperlipidemia, schizophrenia, and CKD stage IV who presents with bilateral lower extremity swelling.   Patient reports that she was taken off of Aldactone  in May by her PCP and began to develop bilateral lower extremity edema in June.  She was seen in the emergency department  for leg swelling on 04/24/2024, was given a 5-day course of Lasix , and also had Aldactone  resumed by her PCP.  She improved initially with the Lasix , but the legs have become more swollen again.  She denies chest pain and has not felt particularly short of breath.   ED Course: Upon arrival to the ED, patient is found to be afebrile and saturating mid 90s on room air with normal HR and stable BP.  Labs are most notable for creatinine 2.16, normal WBC, hemoglobin 9.3, and BNP 303.  Chest x-ray demonstrates mild interstitial coarsening.   Patient was treated with 40 mg IV Lasix  in the ED.   Review of Systems:  All other systems reviewed and apart from HPI, are negative.     Hospital Course:    Brief Narrative: Ellen Mcmillan is a 78 y.o. female with a history of HTN, HLD, stage IV CKD, and schizophrenia who presented to the ED on 05/13/2024 with leg swelling. She appeared hypervolemic on exam and CXR revealed interstitial edema consistent with acute on chronic HFpEF for which IV lasix  was started and cardiology was consulted with echocardiogram  Assessment and Plan: Acute on chronic HFpEF: LVEF was 60-65% in July 2023.  - -Diuresed well with IV Lasix  -Weight is down to 173 pounds from 184 pounds couple days ago - Much improved lower extremity edema -No significant dyspnea on exertion -Decrease Norvasc  to 5 mg daily as this may be contributing to lower extremity edema -Stop PTA hydrochlorothiazide - Discharged on Lasix  instead -Continue Aldactone  -Increase Coreg  to 25 mg twice daily -Discharged on losartan  100 mg daily -Echo with EF of greater than 75%, left ventricle is hyperdynamic, no regional wall motion abnormalities, no aortic stenosis, mild mitral regurg noted -Low-salt diet advised =-Cardiology consult appreciate   Stage IV CKD: Cr stable since admission with diuresis.  - -Creatinine up to 2.3 from 2.16 with diuresis -This is close to prior baseline =-Monitor BMP closely with PCP as outpatient due to possible electrolyte derangement concerns in view of Aldactone , losartan  and Lasix  use -  HTN:  --Stable, increase Coreg  to 25 mg twice daily, decrease Norvasc  to 5 mg daily due to lower extremity swelling concerns -Stop HCTZ and use Lasix  instead -Losartan  100 mg daily as advised -Continue Aldactone   Acute on Chronic Anemia--- suspect it's related to CKD Hgb > 9, baseline usually > 10 No Bleeding concerns   HLD: Last LDL was 103 on simvastatin .   - Consider switching to Crestor  due to creatinine clearance   Schizophrenia: - Continue geodon    Discharge Condition: stable  Follow UP   Follow-up Information     Shin Laymon CHRISTELLA, PA-C Follow up on  06/20/2024.   Specialties: Cardiology, Cardiology Why: Cardiology Follow-up on 06/20/2024 at 3:30 PM. Contact information: [address] [place] [address] [phone]         Bevely Doffing, FNP. Schedule an appointment as soon as possible for a visit in 2 week(s).   Specialty: Family Medicine Why: Repeat CBC and BMP Blood Tests Contact information: [address] [place] [address] [phone]                 Consults obtained -cardiology  Diet and Activity recommendation:  As advised  Discharge Instructions    Discharge Instructions     Call MD for:  difficulty breathing, headache or visual disturbances   Complete by: As directed    Call MD for:  persistant dizziness or light-headedness  Complete by: As directed    Call MD for:  persistant nausea and vomiting   Complete by: As directed    Call MD for:  temperature >100.4   Complete by: As directed    Diet - low sodium heart healthy   Complete by: As directed    Discharge instructions   Complete by: As directed    1)Very Low-salt diet advised---Less than 2 gm of Sodium per day advised----ok to use Mrs DASH salt substitute instead of Salt 2)Weigh yourself daily, call if you gain more than 3 pounds in 1 day or more than 5 pounds in 1 week as your diuretic medications may need to be adjusted 3)Stop Hydrochlorothiazide (HCTZ)--- 4)Start Lasix  (Furosemide ) 20 mg daily 5)Repeat BMP and CBC blood test in 1 week with your primary care physician Bennett, Leita, FNP) 6)Avoid ibuprofen/Advil/Aleve/Motrin/Goody Powders/Naproxen/BC powders/Meloxicam/Diclofenac/Indomethacin and other Nonsteroidal anti-inflammatory medications as these will make you more likely to bleed and can cause stomach ulcers, can also cause Kidney problems. 7) decrease amlodipine /Norvasc  to 5 mg daily 8) increase Coreg  to 25 mg twice daily 9) okay to use losartan  100 mg daily (without hCTZ (Hydrochlorothiazide)   Increase activity  slowly   Complete by: As directed         Discharge Medications     Allergies as of 05/15/2024       Reactions   Bee Pollen Other (See Comments)   Seasonal allergies        Medication List     STOP taking these medications    amoxicillin 500 MG capsule Commonly known as: AMOXIL   HAIR SKIN & NAILS PO   ibuprofen 200 MG tablet Commonly known as: ADVIL   losartan -hydrochlorothiazide 100-25 MG tablet Commonly known as: HYZAAR   multivitamin tablet       TAKE these medications    amLODipine  10 MG tablet Commonly known as: NORVASC  Take 0.5 tablets (5 mg total) by mouth daily. For BP What changed:  how much to take additional instructions   carvedilol  25 MG tablet Commonly known as: COREG  Take 1 tablet (25 mg total) by mouth 2 (two) times daily with a meal. What changed:  medication strength how much to take   cetirizine 10 MG tablet Commonly known as: ZYRTEC Take 10 mg by mouth at bedtime.   furosemide  20 MG tablet Commonly known as: LASIX  Take 1 tablet (20 mg total) by mouth daily. Start taking on: May 16, 2024   losartan  100 MG tablet Commonly known as: COZAAR  Take 1 tablet (100 mg total) by mouth daily.   pantoprazole  40 MG tablet Commonly known as: PROTONIX  Take 1 tablet (40 mg total) by mouth daily. What changed: when to take this   simvastatin  20 MG tablet Commonly known as: ZOCOR  Take 1 tablet (20 mg total) by mouth daily. What changed: when to take this   spironolactone  25 MG tablet Commonly known as: ALDACTONE  Take 1 tablet (25 mg total) by mouth daily.   ziprasidone  80 MG capsule Commonly known as: GEODON  Take 1 capsule (80 mg total) by mouth 2 (two) times daily with a meal.       Major procedures and Radiology Reports - PLEASE review detailed and final reports for all details, in brief -   ECHOCARDIOGRAM COMPLETE Result Date: 05/14/2024    ECHOCARDIOGRAM REPORT   Patient Name:   Ellen Mcmillan Date of Exam: 05/14/2024  Medical Rec #:  [id number]       Height:  64.0 in Accession #:    [id number]      Weight:       183.6 lb Date of Birth:  1946/06/03        BSA:          1.887 m Patient Age:    78 years        BP:           166/50 mmHg Patient Gender: F               HR:           77 bpm. Exam Location:  [facility] Procedure: 2D Echo, Cardiac Doppler and Color Doppler (Both Spectral and Color            Flow Doppler were utilized during procedure). Indications:    I50.40* Unspecified combined systolic (congestive) and diastolic                 (congestive) heart failure  History:        Patient has prior history of Echocardiogram examinations, most                 recent 05/04/2022. Signs/Symptoms:Altered Mental Status; Risk                 Factors:Hypertension and Dyslipidemia.  Sonographer:    Ellouise Mose RDCS Referring Phys: [id number] TIMOTHY S OPYD  Sonographer Comments: Image acquisition challenging due to respiratory motion. Patient had trouble staying awake to follow breathing directions. IMPRESSIONS  1. Left ventricular ejection fraction, by estimation, is >75%. The left ventricle has hyperdynamic function. The left ventricle has no regional wall motion abnormalities. Left ventricular diastolic parameters are indeterminate.  2. Right ventricular systolic function is normal. The right ventricular size is normal. Tricuspid regurgitation signal is inadequate for assessing PA pressure.  3. The mitral valve is normal in structure. Mild mitral valve regurgitation.  4. The aortic valve is tricuspid. Aortic valve regurgitation is not visualized. Aortic valve sclerosis/calcification is present, without any evidence of aortic stenosis.  5. The inferior vena cava is normal in size with greater than 50% respiratory variability, suggesting right atrial pressure of 3 mmHg. FINDINGS  Left Ventricle: Left ventricular ejection fraction, by estimation, is >75%. The left ventricle has hyperdynamic function. The left ventricle has no regional  wall motion abnormalities. The left ventricular internal cavity size was normal in size. There is no left ventricular hypertrophy. Left ventricular diastolic parameters are indeterminate. Right Ventricle: The right ventricular size is normal. Right vetricular wall thickness was not assessed. Right ventricular systolic function is normal. Tricuspid regurgitation signal is inadequate for assessing PA pressure. Left Atrium: Left atrial size was normal in size. Right Atrium: Right atrial size was normal in size. Pericardium: There is no evidence of pericardial effusion. Mitral Valve: The mitral valve is normal in structure. Mild mitral valve regurgitation. Tricuspid Valve: The tricuspid valve is grossly normal. Tricuspid valve regurgitation is trivial. Aortic Valve: The aortic valve is tricuspid. Aortic valve regurgitation is not visualized. Aortic valve sclerosis/calcification is present, without any evidence of aortic stenosis. Pulmonic Valve: The pulmonic valve was not well visualized. Pulmonic valve regurgitation is mild. No evidence of pulmonic stenosis. Aorta: The aortic root and ascending aorta are structurally normal, with no evidence of dilitation. Venous: The inferior vena cava is normal in size with greater than 50% respiratory variability, suggesting right atrial pressure of 3 mmHg. IAS/Shunts: No atrial level shunt detected by color flow Doppler.  LEFT VENTRICLE PLAX 2D  LVIDd:         4.20 cm     Diastology LVIDs:         2.60 cm     LV e' medial:    5.77 cm/s LV PW:         1.10 cm     LV E/e' medial:  16.8 LV IVS:        1.10 cm     LV e' lateral:   9.46 cm/s LVOT diam:     2.00 cm     LV E/e' lateral: 10.3 LV SV:         71 LV SV Index:   38 LVOT Area:     3.14 cm  LV Volumes (MOD) LV vol d, MOD A2C: 34.2 ml LV vol d, MOD A4C: 72.7 ml LV vol s, MOD A2C: 7.8 ml LV vol s, MOD A4C: 17.4 ml LV SV MOD A2C:     26.4 ml LV SV MOD A4C:     72.7 ml LV SV MOD BP:      40.5 ml RIGHT VENTRICLE             IVC RV S  prime:     12.70 cm/s  IVC diam: 1.90 cm TAPSE (M-mode): 1.8 cm LEFT ATRIUM             Index        RIGHT ATRIUM           Index LA diam:        4.30 cm 2.28 cm/m   RA Area:     10.60 cm LA Vol (A2C):   50.4 ml 26.71 ml/m  RA Volume:   20.10 ml  10.65 ml/m LA Vol (A4C):   52.3 ml 27.72 ml/m LA Biplane Vol: 51.9 ml 27.51 ml/m  AORTIC VALVE LVOT Vmax:   123.00 cm/s LVOT Vmean:  79.500 cm/s LVOT VTI:    0.227 m  AORTA Ao Root diam: 2.70 cm Ao Asc diam:  2.60 cm MITRAL VALVE MV Area (PHT): 3.75 cm    SHUNTS MV Decel Time: 203 msec    Systemic VTI:  0.23 m MV E velocity: 97.00 cm/s  Systemic Diam: 2.00 cm MV A velocity: 93.60 cm/s MV E/A ratio:  1.04 Vina Gull MD Electronically signed by Vina Gull MD Signature Date/Time: 05/14/2024/3:41:55 PM    Final    DG Ankle Complete Right Result Date: 05/13/2024 CLINICAL DATA:  ankle pain EXAM: RIGHT ANKLE - COMPLETE 3+ VIEW COMPARISON:  X-ray right ankle 03/26/2020 FINDINGS: Screw fixation of the distal fibula. Screw fixation of the medial malleolus as well as K-wire. Vague linear lucency through an old healed posterior malleolar fracture. There is no evidence of fracture, dislocation, or joint effusion. There is no evidence of arthropathy or other focal bone abnormality. Diffuse ankle and foot subcutaneus soft tissue edema. IMPRESSION: 1. No acute displaced fracture or dislocation. 2. Vague linear lucency through an old healed posterior malleolar fracture. Correlate with point tenderness to palpation to evaluate for acute nondisplaced fracture 3. Screw fixation of the distal fibula. Screw fixation of the medial malleolus as well as K-wire. Electronically Signed   By: Morgane  Naveau M.D.   On: 05/13/2024 22:15   DG Chest Portable 1 View Result Date: 05/13/2024 CLINICAL DATA:  Chest pain.  Bilateral leg swelling. EXAM: PORTABLE CHEST 1 VIEW COMPARISON:  04/24/2024 FINDINGS: Stable heart size and mediastinal contours. Mild interstitial coarsening. No confluent  airspace disease. Subsegmental atelectasis/scarring at the  lung bases. No pleural effusion. On limited assessment, no acute osseous findings. IMPRESSION: Mild interstitial coarsening, may represent mild pulmonary edema. Electronically Signed   By: Andrea Gasman M.D.   On: 05/13/2024 20:48   DG Chest 2 View Result Date: 04/24/2024 CLINICAL DATA:  Leg swelling EXAM: CHEST - 2 VIEW COMPARISON:  CT without contrast 01/23/2024 FINDINGS: Slight elevation of the left hemidiaphragm. There is some linear opacity at the bases likely scar or atelectasis. No consolidation, pneumothorax or effusion. Normal cardiopericardial silhouette. Prominent central vasculature. Calcified aorta. IMPRESSION: Slight elevation of left hemidiaphragm. There is some linear opacity left lung base likely scar or atelectasis. Electronically Signed   By: Ranell Bring M.D.   On: 04/24/2024 17:44   Today   Subjective    Sherrilyn Louder today has no new concerns  - Voiding well -no dyspnea on exertion  No fever  Or chills   No Nausea, Vomiting or Diarrhea   Patient has been seen and examined prior to discharge   Objective   Blood pressure (!) 160/50, pulse 71, temperature 97.7 F (36.5 C), temperature source Oral, resp. rate 20, height 5' 4 (1.626 m), weight 78.9 kg, SpO2 95%.   Intake/Output Summary (Last 24 hours) at 05/15/2024 1326 Last data filed at 05/15/2024 1046 Gross per 24 hour  Intake 960 ml  Output 4350 ml  Net -3390 ml   Exam Gen:- Awake Alert, no acute distress  HEENT:- [place].AT, No sclera icterus Neck-Supple Neck,No JVD,.  Lungs-  Improved air movement, No wheezing CV- S1, S2 normal, regular Abd-  +ve B.Sounds, Abd Soft, No tenderness,    Extremity/Skin:- No  edema,   good pulses Psych-affect is appropriate, oriented x3 Neuro-no new focal deficits, no tremors    Data Review   CBC w Diff:  Lab Results  Component Value Date   WBC 7.2 05/15/2024   HGB 9.9 (L) 05/15/2024   HGB 10.8 (L) 10/09/2023    HCT 30.6 (L) 05/15/2024   HCT 32.6 (L) 10/09/2023   PLT 282 05/15/2024   PLT 282 10/09/2023   LYMPHOPCT 35 05/13/2024   MONOPCT 11 05/13/2024   EOSPCT 2 05/13/2024   BASOPCT 0 05/13/2024   CMP:  Lab Results  Component Value Date   NA 139 05/15/2024   NA 138 03/25/2024   K 4.3 05/15/2024   CL 100 05/15/2024   CO2 27 05/15/2024   BUN 27 (H) 05/15/2024   BUN 32 (H) 03/25/2024   CREATININE 2.30 (H) 05/15/2024   PROT 7.4 05/13/2024   PROT 7.3 03/25/2024   ALBUMIN 3.6 05/13/2024   ALBUMIN 4.4 03/25/2024   BILITOT 0.5 05/13/2024   BILITOT 0.3 03/25/2024   ALKPHOS 85 05/13/2024   AST 25 05/13/2024   ALT 26 05/13/2024   Total Discharge time is about 33 minutes  Rendall Carwin M.D on 05/15/2024 at 1:26 PM  Go to [email or web address] -  for contact info  [facility] - Office  [phone]

## 2024-05-15 NOTE — Discharge Instructions (Signed)
 1)Very Low-salt diet advised---Less than 2 gm of Sodium per day advised----ok to use Mrs DASH salt substitute instead of Salt 2)Weigh yourself daily, call if you gain more than 3 pounds in 1 day or more than 5 pounds in 1 week as your diuretic medications may need to be adjusted 3)Stop Hydrochlorothiazide (HCTZ)--- 4)Start Lasix  (Furosemide ) 20 mg daily 5)Repeat BMP and CBC blood test in 1 week with your primary care physician Bennett, Leita, FNP) 6)Avoid ibuprofen/Advil/Aleve/Motrin/Goody Powders/Naproxen/BC powders/Meloxicam/Diclofenac/Indomethacin and other Nonsteroidal anti-inflammatory medications as these will make you more likely to bleed and can cause stomach ulcers, can also cause Kidney problems. 7) decrease amlodipine /Norvasc  to 5 mg daily 8) increase Coreg  to 25 mg twice daily 9) okay to use losartan  100 mg daily (without hCTZ (Hydrochlorothiazide)

## 2024-05-15 NOTE — Assessment & Plan Note (Signed)
 Elevated today.  Will restart spironolactone  and refer her to nephrology for further evaluation.

## 2024-05-15 NOTE — Progress Notes (Signed)
 Discharge instructions reviewed with patient and patient's brother.  Both verbalized understanding of instructions. Patient discharged home with brother in stable condition.

## 2024-05-15 NOTE — Care Management Important Message (Signed)
 Important Message  Patient Details  Name: Ellen Mcmillan MRN: [id number] Date of Birth: 02/01/1946   Important Message Given:  N/A - LOS <3 / Initial given by admissions     Ellen Mcmillan Ada 05/15/2024, 11:13 AM

## 2024-05-15 NOTE — Progress Notes (Addendum)
 Rounding Note   Patient Name: Ellen Mcmillan Date of Encounter: 05/15/2024  [facility] Cardiologist: Vinie JAYSON Maxcy, MD   Subjective  Breathing is better   Feet still hurt    Scheduled Meds:  amLODipine   5 mg Oral Daily   carvedilol   12.5 mg Oral BID WC   heparin   5,000 Units Subcutaneous Q8H   pantoprazole   40 mg Oral BID   rosuvastatin   10 mg Oral Daily   sodium chloride  flush  3 mL Intravenous Q12H   spironolactone   25 mg Oral Daily   ziprasidone   80 mg Oral BID WC   Continuous Infusions:  PRN Meds: acetaminophen  **OR** acetaminophen , mouth rinse, prochlorperazine , senna   Vital Signs  Vitals:   05/14/24 1507 05/14/24 2008 05/15/24 0353 05/15/24 0446  BP: 124/79 (!) 145/48 (!) 160/50   Pulse: 79 81 71   Resp: 19 14 20    Temp: 97.7 F (36.5 C)     TempSrc: Oral     SpO2: 92% 92% 95%   Weight:    78.9 kg  Height:        Intake/Output Summary (Last 24 hours) at 05/15/2024 0825 Last data filed at 05/15/2024 0752 Gross per 24 hour  Intake 960 ml  Output 4900 ml  Net -3940 ml   Net   I/O 5.1 L      05/15/2024    4:46 AM 05/14/2024    5:00 AM 05/13/2024   11:36 PM  Last 3 Weights  Weight (lbs) 173 lb 15.1 oz 183 lb 10.3 oz 184 lb 1.4 oz  Weight (kg) 78.9 kg 83.3 kg 83.5 kg      Telemetry SR   60s  - Personally Reviewed  ECG  No new  - Personally Reviewed  Physical Exam  GEN: No acute distress.   Neck: No JVD Cardiac: RRR, no murmurs Respiratory: Clear to auscultation  GI: Soft, nontender, non-distended  MS: Tr LE  edema; No deformity.   Labs High Sensitivity Troponin:  No results for input(s): TROPONINIHS in the last 720 hours.   Chemistry Recent Labs  Lab 05/13/24 1955 05/14/24 0426 05/15/24 0422  NA 133* 139 139  K 4.9 4.9 4.3  CL 103 108 100  CO2 18* 22 27  GLUCOSE 125* 124* 118*  BUN 25* 25* 27*  CREATININE 2.16* 2.16* 2.30*  CALCIUM  8.9 9.0 9.2  MG  --  2.0  --   PROT 7.4  --   --   ALBUMIN 3.6  --   --   AST  25  --   --   ALT 26  --   --   ALKPHOS 85  --   --   BILITOT 0.5  --   --   GFRNONAA 23* 23* 21*  ANIONGAP 12 9 12     Lipids No results for input(s): CHOL, TRIG, HDL, LABVLDL, LDLCALC, CHOLHDL in the last 168 hours.  Hematology Recent Labs  Lab 05/13/24 1955 05/14/24 0426 05/15/24 0422  WBC 7.6 7.8 7.2  RBC 2.84* 2.83* 3.07*  HGB 9.3* 9.2* 9.9*  HCT 28.8* 28.7* 30.6*  MCV 101.4* 101.4* 99.7  MCH 32.7 32.5 32.2  MCHC 32.3 32.1 32.4  RDW 12.4 12.4 12.3  PLT 253 257 282   Thyroid  No results for input(s): TSH, FREET4 in the last 168 hours.  BNP Recent Labs  Lab 05/13/24 1955  BNP 303.0*    DDimer No results for input(s): DDIMER in the last 168 hours.   Radiology  ECHOCARDIOGRAM COMPLETE Result Date: 05/14/2024    ECHOCARDIOGRAM REPORT   Patient Name:   Ellen Mcmillan Date of Exam: 05/14/2024 Medical Rec #:  [id number]       Height:       64.0 in Accession #:    [id number]      Weight:       183.6 lb Date of Birth:  05-20-1946        BSA:          1.887 m Patient Age:    78 years        BP:           166/50 mmHg Patient Gender: F               HR:           77 bpm. Exam Location:  [facility] Procedure: 2D Echo, Cardiac Doppler and Color Doppler (Both Spectral and Color            Flow Doppler were utilized during procedure). Indications:    I50.40* Unspecified combined systolic (congestive) and diastolic                 (congestive) heart failure  History:        Patient has prior history of Echocardiogram examinations, most                 recent 05/04/2022. Signs/Symptoms:Altered Mental Status; Risk                 Factors:Hypertension and Dyslipidemia.  Sonographer:    Ellouise Mose RDCS Referring Phys: [id number] TIMOTHY S OPYD  Sonographer Comments: Image acquisition challenging due to respiratory motion. Patient had trouble staying awake to follow breathing directions. IMPRESSIONS  1. Left ventricular ejection fraction, by estimation, is >75%. The left ventricle has  hyperdynamic function. The left ventricle has no regional wall motion abnormalities. Left ventricular diastolic parameters are indeterminate.  2. Right ventricular systolic function is normal. The right ventricular size is normal. Tricuspid regurgitation signal is inadequate for assessing PA pressure.  3. The mitral valve is normal in structure. Mild mitral valve regurgitation.  4. The aortic valve is tricuspid. Aortic valve regurgitation is not visualized. Aortic valve sclerosis/calcification is present, without any evidence of aortic stenosis.  5. The inferior vena cava is normal in size with greater than 50% respiratory variability, suggesting right atrial pressure of 3 mmHg. FINDINGS  Left Ventricle: Left ventricular ejection fraction, by estimation, is >75%. The left ventricle has hyperdynamic function. The left ventricle has no regional wall motion abnormalities. The left ventricular internal cavity size was normal in size. There is no left ventricular hypertrophy. Left ventricular diastolic parameters are indeterminate. Right Ventricle: The right ventricular size is normal. Right vetricular wall thickness was not assessed. Right ventricular systolic function is normal. Tricuspid regurgitation signal is inadequate for assessing PA pressure. Left Atrium: Left atrial size was normal in size. Right Atrium: Right atrial size was normal in size. Pericardium: There is no evidence of pericardial effusion. Mitral Valve: The mitral valve is normal in structure. Mild mitral valve regurgitation. Tricuspid Valve: The tricuspid valve is grossly normal. Tricuspid valve regurgitation is trivial. Aortic Valve: The aortic valve is tricuspid. Aortic valve regurgitation is not visualized. Aortic valve sclerosis/calcification is present, without any evidence of aortic stenosis. Pulmonic Valve: The pulmonic valve was not well visualized. Pulmonic valve regurgitation is mild. No evidence of pulmonic stenosis. Aorta: The aortic root  and ascending aorta are structurally normal,  with no evidence of dilitation. Venous: The inferior vena cava is normal in size with greater than 50% respiratory variability, suggesting right atrial pressure of 3 mmHg. IAS/Shunts: No atrial level shunt detected by color flow Doppler.  LEFT VENTRICLE PLAX 2D LVIDd:         4.20 cm     Diastology LVIDs:         2.60 cm     LV e' medial:    5.77 cm/s LV PW:         1.10 cm     LV E/e' medial:  16.8 LV IVS:        1.10 cm     LV e' lateral:   9.46 cm/s LVOT diam:     2.00 cm     LV E/e' lateral: 10.3 LV SV:         71 LV SV Index:   38 LVOT Area:     3.14 cm  LV Volumes (MOD) LV vol d, MOD A2C: 34.2 ml LV vol d, MOD A4C: 72.7 ml LV vol s, MOD A2C: 7.8 ml LV vol s, MOD A4C: 17.4 ml LV SV MOD A2C:     26.4 ml LV SV MOD A4C:     72.7 ml LV SV MOD BP:      40.5 ml RIGHT VENTRICLE             IVC RV S prime:     12.70 cm/s  IVC diam: 1.90 cm TAPSE (M-mode): 1.8 cm LEFT ATRIUM             Index        RIGHT ATRIUM           Index LA diam:        4.30 cm 2.28 cm/m   RA Area:     10.60 cm LA Vol (A2C):   50.4 ml 26.71 ml/m  RA Volume:   20.10 ml  10.65 ml/m LA Vol (A4C):   52.3 ml 27.72 ml/m LA Biplane Vol: 51.9 ml 27.51 ml/m  AORTIC VALVE LVOT Vmax:   123.00 cm/s LVOT Vmean:  79.500 cm/s LVOT VTI:    0.227 m  AORTA Ao Root diam: 2.70 cm Ao Asc diam:  2.60 cm MITRAL VALVE MV Area (PHT): 3.75 cm    SHUNTS MV Decel Time: 203 msec    Systemic VTI:  0.23 m MV E velocity: 97.00 cm/s  Systemic Diam: 2.00 cm MV A velocity: 93.60 cm/s MV E/A ratio:  1.04 Vina Gull MD Electronically signed by Vina Gull MD Signature Date/Time: 05/14/2024/3:41:55 PM    Final    DG Ankle Complete Right Result Date: 05/13/2024 CLINICAL DATA:  ankle pain EXAM: RIGHT ANKLE - COMPLETE 3+ VIEW COMPARISON:  X-ray right ankle 03/26/2020 FINDINGS: Screw fixation of the distal fibula. Screw fixation of the medial malleolus as well as K-wire. Vague linear lucency through an old healed posterior malleolar  fracture. There is no evidence of fracture, dislocation, or joint effusion. There is no evidence of arthropathy or other focal bone abnormality. Diffuse ankle and foot subcutaneus soft tissue edema. IMPRESSION: 1. No acute displaced fracture or dislocation. 2. Vague linear lucency through an old healed posterior malleolar fracture. Correlate with point tenderness to palpation to evaluate for acute nondisplaced fracture 3. Screw fixation of the distal fibula. Screw fixation of the medial malleolus as well as K-wire. Electronically Signed   By: Morgane  Naveau M.D.   On: 05/13/2024 22:15   DG Chest  Portable 1 View Result Date: 05/13/2024 CLINICAL DATA:  Chest pain.  Bilateral leg swelling. EXAM: PORTABLE CHEST 1 VIEW COMPARISON:  04/24/2024 FINDINGS: Stable heart size and mediastinal contours. Mild interstitial coarsening. No confluent airspace disease. Subsegmental atelectasis/scarring at the lung bases. No pleural effusion. On limited assessment, no acute osseous findings. IMPRESSION: Mild interstitial coarsening, may represent mild pulmonary edema. Electronically Signed   By: Andrea Gasman M.D.   On: 05/13/2024 20:48    Cardiac Studies Echo   May 14, 2024   1. Left ventricular ejection fraction, by estimation, is >75%. The left  ventricle has hyperdynamic function. The left ventricle has no regional  wall motion abnormalities. Left ventricular diastolic parameters are  indeterminate.   2. Right ventricular systolic function is normal. The right ventricular  size is normal. Tricuspid regurgitation signal is inadequate for assessing  PA pressure.   3. The mitral valve is normal in structure. Mild mitral valve  regurgitation.   4. The aortic valve is tricuspid. Aortic valve regurgitation is not  visualized. Aortic valve sclerosis/calcification is present, without any  evidence of aortic stenosis.   5. The inferior vena cava is normal in size with greater than 50%  respiratory variability,  suggesting right atrial pressure of 3 mmHg.    Echo  July 2023  1. Left ventricular ejection fraction, by estimation, is 60 to 65%. The  left ventricle has normal function. The left ventricle has no regional  wall motion abnormalities. There is mild concentric left ventricular  hypertrophy. Left ventricular diastolic  parameters are indeterminate.   2. Right ventricular systolic function is normal. The right ventricular  size is normal. Tricuspid regurgitation signal is inadequate for assessing  PA pressure.   3. The mitral valve is grossly normal. Trivial mitral valve  regurgitation. No evidence of mitral stenosis.   4. The aortic valve is tricuspid. There is mild calcification of the  aortic valve. There is mild thickening of the aortic valve. Aortic valve  regurgitation is trivial.   5. The inferior vena cava is normal in size with greater than 50%  respiratory variability, suggesting right atrial pressure of 3 mmHg.      Patient Profile   elen LYNISHA OSUCH is a 78 y.o. female with a hx of HTN, Type 2 DM, HLD, Stage 4 CKD and schizophrenia who is being seen 05/14/2024 for the evaluation of acute CHF at the request of Dr. Bryn.    Assessment & Plan   1. Acute HFpEF/Lower Extremity Edema - Presented with worsening lower extremity edema and BNP mildly elevated at 303. She has been started on IV Lasix  with a recorded net output of 4.9 L   Looks good today   Echo noted above  LVEf and RVEF normal    I would recomm lasix  20 mg per day as outpt   Don't restart HCZ  Start Lasix  tomorrow    2  HTN BP is improved but labile  SBP 120s to 160s    She is on amlodipine    Dose was cut back to 5 mg (given edema, pain in legs)   She is also on carvedilol  12.5 bid, spironolactone  25 I will increase carvedilol  to 25 bid   Will need to follow HR and BP as outpt   3 HL   LDL in June 2025 was 103  PT switched from smivistatin to rosuvastatin  10 mg    WIll need to be followed as outpt  4   Stage 4 CKD Baseline Cr  2.0-2.2  Today it is 2.3      2. Bilateral Leg Pain Legs remain tender  R ankle with vague lucency  ? Acute fx     Cut back on amlodipine    Follow  Avoid NSAIDS    Recomm follow up to see if acute fx present    Reviewed diet   LImit salt and carbs     WIll make sure that pt has follow up in clinc   Get BMET next week   Sign off for now    For questions or updates, please contact [facility] Please consult [email or web address] for contact info under     Signed, Vina Gull, MD  05/15/2024, 8:25 AM

## 2024-05-16 ENCOUNTER — Telehealth: Payer: Self-pay

## 2024-05-16 NOTE — Transitions of Care (Post Inpatient/ED Visit) (Signed)
   05/16/2024  Name: Ellen Mcmillan MRN: [id number] DOB: May 09, 1946  Today's TOC FU Call Status: Today's TOC FU Call Status:: Unsuccessful Call (1st Attempt) Unsuccessful Call (1st Attempt) Date: 05/16/24  Attempted to reach the patient regarding the most recent Inpatient/ED visit.  Follow Up Plan: Additional outreach attempts will be made to reach the patient to complete the Transitions of Care (Post Inpatient/ED visit) call.   Alan Ee, RN, BSN, CEN [employer]- Transition of Care Team.  [facility] [phone]

## 2024-05-16 NOTE — Transitions of Care (Post Inpatient/ED Visit) (Signed)
   05/16/2024  Name: Ellen Mcmillan MRN: [id number] DOB: 08-02-1946  Today's TOC FU Call Status: Today's TOC FU Call Status:: Unsuccessful Call (2nd Attempt) Unsuccessful Call (2nd Attempt) Date: 05/16/24  Attempted to reach the patient regarding the most recent Inpatient/ED visit.  Follow Up Plan: Additional outreach attempts will be made to reach the patient to complete the Transitions of Care (Post Inpatient/ED visit) call.   Alan Ee, RN, BSN, CEN [employer]- Transition of Care Team.  [facility] [phone]

## 2024-05-17 ENCOUNTER — Telehealth: Payer: Self-pay

## 2024-05-17 NOTE — Transitions of Care (Post Inpatient/ED Visit) (Signed)
   05/17/2024  Name: Ellen Mcmillan MRN: [id number] DOB: 02-25-1946  Today's TOC FU Call Status: Today's TOC FU Call Status:: Unsuccessful Call (3rd Attempt) Unsuccessful Call (3rd Attempt) Date: 05/17/24  Attempted to reach the patient regarding the most recent Inpatient/ED visit.  Follow Up Plan: No further outreach attempts will be made at this time. We have been unable to contact the patient.  Alan Ee, RN, BSN, CEN [employer]- Transition of Care Team.  [facility] [phone]

## 2024-05-21 ENCOUNTER — Ambulatory Visit: Admitting: Podiatry

## 2024-05-24 ENCOUNTER — Telehealth: Payer: Self-pay

## 2024-05-24 NOTE — Telephone Encounter (Signed)
 Copied from CRM [id number]. Topic: Clinical - Medication Question >> May 24, 2024 11:28 AM Tonda B wrote: Reason for CRM: [facility] wanting to verify about the changes in the patient rx [phone] [facility]

## 2024-05-24 NOTE — Telephone Encounter (Signed)
 Noted, Talked to [place] at Pharmacy

## 2024-05-28 ENCOUNTER — Ambulatory Visit: Payer: Self-pay

## 2024-05-28 VITALS — BP 135/74 | HR 71 | Ht 64.0 in | Wt 178.1 lb

## 2024-05-28 DIAGNOSIS — I5033 Acute on chronic diastolic (congestive) heart failure: Secondary | ICD-10-CM | POA: Diagnosis not present

## 2024-05-28 DIAGNOSIS — D619 Aplastic anemia, unspecified: Secondary | ICD-10-CM

## 2024-05-28 DIAGNOSIS — N184 Chronic kidney disease, stage 4 (severe): Secondary | ICD-10-CM

## 2024-05-28 MED ORDER — FUROSEMIDE 20 MG PO TABS: 20.0000 mg | ORAL_TABLET | Freq: Every day | ORAL | 3 refills | Status: AC

## 2024-05-28 NOTE — Progress Notes (Unsigned)
 Established Patient Office Visit  Subjective   Patient ID: Ellen Mcmillan, female    DOB: May 20, 1946  Age: 78 y.o. MRN: [id number]  Chief Complaint  Patient presents with   Hospitalization Follow-up    Hospital Follow up    HPI  Recommendations for primary care physician for things to follow:  1)Very Low-salt diet advised---Less than 2 gm of Sodium per day advised----ok to use Mrs DASH salt substitute instead of Salt 2)Weigh yourself daily, call if you gain more than 3 pounds in 1 day or more than 5 pounds in 1 week as your diuretic medications may need to be adjusted 3)Stop Hydrochlorothiazide (HCTZ)--- 4)Start Lasix  (Furosemide ) 20 mg daily 5)Repeat BMP and CBC blood test in 1 week with your primary care physician Bennett, Leita, FNP) 6)Avoid ibuprofen/Advil/Aleve/Motrin/Goody Powders/Naproxen/BC powders/Meloxicam/Diclofenac/Indomethacin and other Nonsteroidal anti-inflammatory medications as these will make you more likely to bleed and can cause stomach ulcers, can also cause Kidney problems. 7) decrease amlodipine /Norvasc  to 5 mg daily 8) increase Coreg  to 25 mg twice daily 9) okay to use losartan  100 mg daily (without hCTZ (Hydrochlorothiazide)   Admission Diagnosis  Congestive heart failure, unspecified HF chronicity, unspecified heart failure type (HCC) [I50.9] Acute on chronic heart failure with preserved ejection fraction (HFpEF) (HCC) [I50.33]     Discharge Diagnosis  Congestive heart failure, unspecified HF chronicity, unspecified heart failure type (HCC) [I50.9] Acute on chronic heart failure with preserved ejection fraction (HFpEF) (HCC) [I50.33]     Principal Problem:   Acute on chronic heart failure with preserved ejection fraction (HFpEF) (HCC) Active Problems:   Schizophrenia (HCC)   Hyperlipidemia associated with type 2 diabetes mellitus (HCC)   Hypertension   CKD (chronic kidney disease), stage IV [facility]  Patient Active Problem List   Diagnosis Date Noted    Aplastic anemia (HCC) 05/28/2024   Acute on chronic heart failure with preserved ejection fraction (HFpEF) (HCC) 05/13/2024   Chronic kidney disease, stage 4 (severe) (HCC) 05/13/2024   Gastroesophageal reflux disease without esophagitis 03/26/2024   Hyperlipidemia associated with type 2 diabetes mellitus (HCC) 03/26/2024   Hypertension 03/26/2024   Schizophrenia (HCC) 03/25/2024   Duodenal ulcer 12/05/2023   Constipation 12/05/2023   Pulmonary nodules 12/05/2023   Cough 12/05/2023   Abnormal CT scan, small bowel 09/19/2023   Upper abdominal pain 09/19/2023   Pain due to onychomycosis of toenails of both feet 10/28/2022    ROS    Objective:     BP 135/74   Pulse 71   Ht 5' 4 (1.626 m)   Wt 178 lb 1.9 oz (80.8 kg)   SpO2 95%   BMI 30.57 kg/m  BP Readings from Last 3 Encounters:  05/28/24 135/74  05/15/24 (!) 160/50  05/08/24 (!) 163/65   Wt Readings from Last 3 Encounters:  05/28/24 178 lb 1.9 oz (80.8 kg)  05/15/24 173 lb 15.1 oz (78.9 kg)  05/08/24 186 lb (84.4 kg)     Physical Exam Vitals and nursing note reviewed.  Constitutional:      Appearance: She is obese.  HENT:     Head: Normocephalic.  Eyes:     Extraocular Movements: Extraocular movements intact.     Pupils: Pupils are equal, round, and reactive to light.  Cardiovascular:     Rate and Rhythm: Normal rate and regular rhythm.  Pulmonary:     Effort: Pulmonary effort is normal.     Breath sounds: Normal breath sounds.  Musculoskeletal:     Cervical back: Normal range of  motion and neck supple.     Right lower leg: 1+ Edema present.     Left lower leg: 1+ Edema present.  Neurological:     Mental Status: She is alert and oriented to person, place, and time.  Psychiatric:        Mood and Affect: Mood normal.        Thought Content: Thought content normal.       The 10-year ASCVD risk score (Arnett DK, et al., 2019) is: 29.9%    Assessment & Plan:   Problem List Items Addressed This  Visit       Cardiovascular and Mediastinum   Acute on chronic heart failure with preserved ejection fraction (HFpEF) (HCC)   Stable since recent hospital discharge for kidney failure.  She is monitoring weights daily and denies any significant weight gain since discharge.  No increase in lower leg edema or DOE.   Lasix  refilled today. She has follow-up appointment with cardiology at the end of the month, on 8/28      Relevant Medications   furosemide  (LASIX ) 20 MG tablet     Genitourinary   Chronic kidney disease, stage 4 (severe) (HCC) - Primary   Recheck BMP as advised from hospitalist.      Relevant Orders   Basic Metabolic Panel (BMET) (Completed)     Hematopoietic and Hemostatic   Aplastic anemia (HCC)   Recheck CBC as advised by hospitalist.      Relevant Orders   CBC with Differential/Platelet (Completed)    Return in about 3 months (around 08/28/2024) for chronic follow-up with PCP.    Leita Longs, FNP

## 2024-05-29 ENCOUNTER — Other Ambulatory Visit: Payer: Self-pay

## 2024-05-29 ENCOUNTER — Ambulatory Visit: Payer: Self-pay

## 2024-05-29 DIAGNOSIS — N184 Chronic kidney disease, stage 4 (severe): Secondary | ICD-10-CM

## 2024-05-29 LAB — CBC WITH DIFFERENTIAL/PLATELET
Basophils Absolute: 0.1 x10E3/uL (ref 0.0–0.2)
Basos: 1 %
EOS (ABSOLUTE): 0.1 x10E3/uL (ref 0.0–0.4)
Eos: 2 %
Hematocrit: 33.3 % — ABNORMAL LOW (ref 34.0–46.6)
Hemoglobin: 11.2 g/dL (ref 11.1–15.9)
Immature Grans (Abs): 0 x10E3/uL (ref 0.0–0.1)
Immature Granulocytes: 0 %
Lymphocytes Absolute: 3.4 x10E3/uL — ABNORMAL HIGH (ref 0.7–3.1)
Lymphs: 46 %
MCH: 32.2 pg (ref 26.6–33.0)
MCHC: 33.6 g/dL (ref 31.5–35.7)
MCV: 96 fL (ref 79–97)
Monocytes Absolute: 0.7 x10E3/uL (ref 0.1–0.9)
Monocytes: 10 %
Neutrophils Absolute: 3 x10E3/uL (ref 1.4–7.0)
Neutrophils: 41 %
Platelets: 377 x10E3/uL (ref 150–450)
RBC: 3.48 x10E6/uL — ABNORMAL LOW (ref 3.77–5.28)
RDW: 11.2 % — ABNORMAL LOW (ref 11.7–15.4)
WBC: 7.3 x10E3/uL (ref 3.4–10.8)

## 2024-05-29 LAB — BASIC METABOLIC PANEL WITH GFR
BUN/Creatinine Ratio: 15 (ref 12–28)
BUN: 45 mg/dL — ABNORMAL HIGH (ref 8–27)
CO2: 18 mmol/L — ABNORMAL LOW (ref 20–29)
Calcium: 10.5 mg/dL — ABNORMAL HIGH (ref 8.7–10.3)
Chloride: 98 mmol/L (ref 96–106)
Creatinine, Ser: 2.98 mg/dL — ABNORMAL HIGH (ref 0.57–1.00)
Glucose: 105 mg/dL — ABNORMAL HIGH (ref 70–99)
Potassium: 5.9 mmol/L — ABNORMAL HIGH (ref 3.5–5.2)
Sodium: 134 mmol/L (ref 134–144)
eGFR: 16 mL/min/1.73 — ABNORMAL LOW (ref >59)

## 2024-05-29 NOTE — Assessment & Plan Note (Signed)
 Recheck BMP as advised from hospitalist.

## 2024-05-29 NOTE — Assessment & Plan Note (Signed)
 Recheck CBC as advised by hospitalist.

## 2024-05-29 NOTE — Assessment & Plan Note (Addendum)
 Stable since recent hospital discharge for kidney failure.  She is monitoring weights daily and denies any significant weight gain since discharge.  No increase in lower leg edema or DOE.   Lasix  refilled today. She has follow-up appointment with cardiology at the end of the month, on 8/28

## 2024-05-29 NOTE — Telephone Encounter (Signed)
 FYI Only or Action Required?: Action required by provider: requesting call back with further recommendations for symptoms and asking if need iron supplement.  Patient was last seen in primary care on 05/28/2024 by Bevely Doffing, FNP.  Called Nurse Triage reporting Chills, Cough, Nasal Congestion, Fatigue, anemic, and medication question.  Symptoms began yesterday.  Interventions attempted: Rest, hydration, or home remedies and Other: PCP appt yesterday.  Symptoms are: stable.  Triage Disposition: Home Care  Patient/caregiver understands and will follow disposition?: Yes      Copied from CRM #[id number]. Topic: Clinical - Medical Advice >> May 29, 2024  1:12 PM Wess RAMAN wrote: Reason for CRM: Patient states she is anemic and is always cold. She is wondering is it because she has extra layers of clothing on. She is also wants to know if she needs an iron supplement.  Callback #: [phone]   Reason for Disposition  Cough with cold symptoms (e.g., runny nose, postnasal drip, throat clearing)  Answer Assessment - Initial Assessment Questions 1. ONSET: When did the cough begin?      yesterday 2. SEVERITY: How bad is the cough today?      denies continuous coughing or interrupted sleep 3. SPUTUM: Describe the color of your sputum (e.g., none, dry cough; clear, white, yellow, green)     Not coughing anything up yet 4. HEMOPTYSIS: Are you coughing up any blood? If Yes, ask: How much? (e.g., flecks, streaks, tablespoons, etc.)     denies 5. DIFFICULTY BREATHING: Are you having difficulty breathing? If Yes, ask: How bad is it? (e.g., mild, moderate, severe)      denies 6. FEVER: Do you have a fever? If Yes, ask: What is your temperature, how was it measured, and when did it start?     Denies, started feeling cold yesterday and today, but doc told her that she's anemic 7. CARDIAC HISTORY: Do you have any history of heart disease? (e.g., heart attack, congestive  heart failure)      Chronic heart failure, HTN 8. LUNG HISTORY: Do you have any history of lung disease?  (e.g., pulmonary embolus, asthma, emphysema)     Pulmonary nodules 10. OTHER SYMPTOMS: Do you have any other symptoms? (e.g., runny nose, wheezing, chest pain)       Feel tired, sluggish No SOB or chest pain Blowing my nose, got allergies too Can walk around Get up, get my balance and walk around No dizziness   Took nyquil 1 Tbsp last night and dayquil 1 Tbsp today  Just saw PCP yesterday, wants PCP to know that hospital sent furosemide  20 mg to pharmacy and refilled it already   What can I get for that as far as med for feeling so tired Do I need iron    Advised that sending message to PCP for call back to pt with further recommendations about symptoms and questions about iron supplement. Advised pt call back if any worsening or new symptoms.  Protocols used: Cough - Acute Non-Productive-A-AH

## 2024-05-30 NOTE — Telephone Encounter (Signed)
 Pt advised with verbal understanding

## 2024-05-31 ENCOUNTER — Other Ambulatory Visit (HOSPITAL_COMMUNITY): Payer: Self-pay | Admitting: Nephrology

## 2024-05-31 DIAGNOSIS — N184 Chronic kidney disease, stage 4 (severe): Secondary | ICD-10-CM

## 2024-05-31 DIAGNOSIS — I129 Hypertensive chronic kidney disease with stage 1 through stage 4 chronic kidney disease, or unspecified chronic kidney disease: Secondary | ICD-10-CM

## 2024-06-01 ENCOUNTER — Ambulatory Visit (HOSPITAL_COMMUNITY)
Admission: RE | Admit: 2024-06-01 | Discharge: 2024-06-01 | Disposition: A | Discharge status: Home / Self Care | Source: Ambulatory Visit | Attending: Acute Care | Admitting: Acute Care

## 2024-06-01 DIAGNOSIS — R911 Solitary pulmonary nodule: Secondary | ICD-10-CM | POA: Insufficient documentation

## 2024-06-04 ENCOUNTER — Encounter: Payer: Self-pay | Admitting: Adult Health

## 2024-06-04 ENCOUNTER — Ambulatory Visit: Admitting: Adult Health

## 2024-06-04 VITALS — BP 120/66 | HR 71 | Ht 64.0 in | Wt 179.5 lb

## 2024-06-04 DIAGNOSIS — I10 Essential (primary) hypertension: Secondary | ICD-10-CM

## 2024-06-04 DIAGNOSIS — Z1331 Encounter for screening for depression: Secondary | ICD-10-CM

## 2024-06-04 DIAGNOSIS — Z01419 Encounter for gynecological examination (general) (routine) without abnormal findings: Secondary | ICD-10-CM | POA: Diagnosis not present

## 2024-06-04 DIAGNOSIS — Z90710 Acquired absence of both cervix and uterus: Secondary | ICD-10-CM | POA: Diagnosis not present

## 2024-06-04 DIAGNOSIS — Z1231 Encounter for screening mammogram for malignant neoplasm of breast: Secondary | ICD-10-CM | POA: Insufficient documentation

## 2024-06-04 NOTE — Progress Notes (Signed)
 Patient ID: Ellen Mcmillan, female   DOB: 1946/04/11, 78 y.o.   MRN: [id number] History of Present Illness: Ellen Mcmillan is a 78 year old black female, widowed, sp hysterectomy in for a well woman gyn exam. She as recently in hospital for CHF.   PCP is Leita Longs FNP    Current Medications, Allergies, Past Medical History, Past Surgical History, Family History and Social History were reviewed in [employer] record.     Review of Systems: Patient denies any headaches, hearing loss, fatigue, blurred vision, shortness of breath, chest pain, abdominal pain, problems with bowel movements(has been loose lately, sees GI), urination, or intercourse(not active, no sex in years). No joint pain or mood swings.  Feet hurt.     Physical Exam:BP 120/66 (BP Location: Right Arm, Patient Position: Sitting, Cuff Size: Normal)   Pulse 71   Ht 5' 4 (1.626 m)   Wt 179 lb 8 oz (81.4 kg)   BMI 30.81 kg/m   General:  Well developed, well nourished, no acute distress Skin:  Warm and dry Neck:  Midline trachea, normal thyroid , good ROM, no lymphadenopathy, no carotid bruits  Lungs; Clear to auscultation bilaterally Breast:  No dominant palpable mass, retraction, or nipple discharge Cardiovascular: Regular rate and rhythm Abdomen:  Soft, non tender, no hepatosplenomegaly Pelvic:  External genitalia is normal in appearance, no lesions.  The vagina is pale. Urethra has no lesions or masses. The cervix and uterus are absent.  No adnexal masses or tenderness noted.Bladder is non tender, no masses felt. Rectal: Deferred  Extremities/musculoskeletal:  No swelling or varicosities noted, no clubbing or cyanosis Psych:  No mood changes, alert and cooperative,seems happy AA is 0 Fall risk is moderate    06/04/2024    9:20 AM 05/08/2024    3:32 PM 03/25/2024    1:11 PM  Depression screen PHQ 2/9  Decreased Interest 0 0 0  Down, Depressed, Hopeless 0 0 0  PHQ - 2 Score 0 0 0  Altered sleeping 0 0 0   Tired, decreased energy 0 0 2  Change in appetite 0 0 0  Feeling bad or failure about yourself  0 0 0  Trouble concentrating 1 0 0  Moving slowly or fidgety/restless 1 0 0  Suicidal thoughts 0 0 0  PHQ-9 Score 2 0 2  Difficult doing work/chores  Not difficult at all Not difficult at all       06/04/2024    9:20 AM 05/08/2024    3:32 PM 03/25/2024    1:12 PM  GAD 7 : Generalized Anxiety Score  Nervous, Anxious, on Edge 0 0 0  Control/stop worrying 0 0 0  Worry too much - different things 0 0 0  Trouble relaxing 0 0 0  Restless 0 0 0  Easily annoyed or irritable 0 0 0  Afraid - awful might happen 0 3 0  Total GAD 7 Score 0 3 0  Anxiety Difficulty  Not difficult at all Not difficult at all      Upstream - 06/04/24 0916       Pregnancy Intention Screening   Does the patient want to become pregnant in the next year? N/A    Does the patient's partner want to become pregnant in the next year? N/A    Would the patient like to discuss contraceptive options today? N/A      Contraception Wrap Up   Current Method Female Sterilization   hyst   End Method Female Sterilization  hyst   Contraception Counseling Provided No         Examination chaperoned by Clarita Salt LPN   Impression and plan: 1. Encounter for well woman exam with routine gynecological exam (Primary) Physical with PCP Labs with PCP  2. Hypertension, unspecified type On BP meds, take as directed and Follow up with PCP  3. S/P hysterectomy Had fibroids about 30 years ago, she says   4. Screening mammogram for breast cancer Mammogram scheduled for her at [facility] for 06/17/24 at 2:45 pm, she is aware of date and time  - MM 3D SCREENING MAMMOGRAM BILATERAL BREAST; Future

## 2024-06-05 ENCOUNTER — Ambulatory Visit: Admitting: Acute Care

## 2024-06-11 ENCOUNTER — Ambulatory Visit (INDEPENDENT_AMBULATORY_CARE_PROVIDER_SITE_OTHER): Admitting: Podiatry

## 2024-06-11 ENCOUNTER — Encounter: Payer: Self-pay | Admitting: Podiatry

## 2024-06-11 DIAGNOSIS — M79674 Pain in right toe(s): Secondary | ICD-10-CM | POA: Diagnosis not present

## 2024-06-11 DIAGNOSIS — B351 Tinea unguium: Secondary | ICD-10-CM | POA: Diagnosis not present

## 2024-06-11 DIAGNOSIS — M79675 Pain in left toe(s): Secondary | ICD-10-CM

## 2024-06-11 NOTE — Progress Notes (Signed)
    Subjective:  Patient ID: Ellen Mcmillan, female    DOB: 08-13-1946,  MRN: [id number]  Ellen Mcmillan presents to clinic today for:  Chief Complaint  Patient presents with   Diabetes    [facility] nail trim. A1c 5.9.  No anti coags   Patient notes nails are thick, discolored, elongated and painful in shoegear when trying to ambulate.  She was recently in the hospital for CHF.  She is currently wearing teds compression stockings and taking her diuretics daily.  States that she has had to weigh herself daily and report them to her PCP.  PCP is Bevely Doffing, FNP.  Past Medical History:  Diagnosis Date   CKD (chronic kidney disease), stage IV (HCC) 05/13/2024   Diabetes mellitus without complication (HCC)    HLD (hyperlipidemia)    Hypertension    Ovarian cyst    Schizophrenia [facility])     Past Surgical History:  Procedure Laterality Date   ABDOMINAL HYSTERECTOMY     BIOPSY  10/24/2023   Procedure: BIOPSY;  Surgeon: Cindie Carlin POUR, DO;  Location: [facility];  Service: Endoscopy;;   ESOPHAGOGASTRODUODENOSCOPY (EGD) WITH PROPOFOL  N/A 10/24/2023   Procedure: ESOPHAGOGASTRODUODENOSCOPY (EGD) WITH PROPOFOL ;  Surgeon: Cindie Carlin POUR, DO;  Location: [facility];  Service: Endoscopy;  Laterality: N/A;  1:45 pm, asa 2   FRACTURE SURGERY Right    ankle   TUBAL LIGATION     Allergies  Allergen Reactions   Bee Pollen Other (See Comments)    Seasonal allergies    Review of Systems: Negative except as noted in the HPI.  Objective:  Ellen Mcmillan is a pleasant 78 y.o. female in NAD. AAO x 3.  Vascular Examination: Capillary refill time is 3-5 seconds to toes bilateral. Palpable pedal pulses b/l LE. Digital hair sparse b/l.  Skin temperature gradient WNL b/l.  +1 pitting edema bilateral legs and ankles  Dermatological Examination: Pedal skin with normal turgor, texture and tone b/l. No open wounds. No interdigital macerations b/l. Toenails x10 are 3mm thick, discolored,  dystrophic with subungual debris. There is pain with compression of the nail plates.  They are elongated x10  Assessment/Plan: 1. Pain due to onychomycosis of toenails of both feet    The mycotic toenails were sharply debrided x10 with sterile nail nippers and a power debriding burr to decrease bulk/thickness and length.    Return in about 3 months (around 09/11/2024) for [facility].   Ellen Mcmillan, DPM, FACFAS [facility]     [address] [place], [place] [address]                Office [phone]  Fax [phone]

## 2024-06-12 ENCOUNTER — Other Ambulatory Visit: Payer: Self-pay

## 2024-06-13 ENCOUNTER — Other Ambulatory Visit: Payer: Self-pay

## 2024-06-13 ENCOUNTER — Ambulatory Visit (HOSPITAL_COMMUNITY)
Admission: RE | Admit: 2024-06-13 | Discharge: 2024-06-13 | Disposition: A | Discharge status: Home / Self Care | Source: Ambulatory Visit | Attending: Nephrology | Admitting: Nephrology

## 2024-06-13 DIAGNOSIS — N184 Chronic kidney disease, stage 4 (severe): Secondary | ICD-10-CM | POA: Insufficient documentation

## 2024-06-13 DIAGNOSIS — F209 Schizophrenia, unspecified: Secondary | ICD-10-CM

## 2024-06-13 DIAGNOSIS — I129 Hypertensive chronic kidney disease with stage 1 through stage 4 chronic kidney disease, or unspecified chronic kidney disease: Secondary | ICD-10-CM | POA: Diagnosis present

## 2024-06-17 ENCOUNTER — Encounter (HOSPITAL_COMMUNITY): Payer: Self-pay

## 2024-06-17 ENCOUNTER — Ambulatory Visit (HOSPITAL_COMMUNITY)
Admission: RE | Admit: 2024-06-17 | Discharge: 2024-06-17 | Disposition: A | Discharge status: Home / Self Care | Source: Ambulatory Visit | Attending: Adult Health | Admitting: Adult Health

## 2024-06-17 DIAGNOSIS — Z1231 Encounter for screening mammogram for malignant neoplasm of breast: Secondary | ICD-10-CM | POA: Insufficient documentation

## 2024-06-19 NOTE — Progress Notes (Unsigned)
 Cardiology Office Note    Date:  06/21/2024  ID:  Mcmillan, Ellen 12-17-1945, MRN [id number] Cardiologist: Ellen JAYSON Maxcy, MD { : History of Present Illness:    Ellen Mcmillan is a 78 y.o. female with past medical history of HTN, HLD, Type II DM, Stage IV CKD, schizophrenia and chronic HFpEF who presents to the office today for hospital follow-up.  She was hospitalized in 04/2024 for an acute CHF exacerbation and BNP was at 303 on admission. She had been on HCTZ prior to admission and this was discontinued and Amlodipine  was reduced to 5 mg daily as she previously experienced worsening edema with 10 mg daily in the past. She responded well to IV Lasix  and was ultimately transitioned to Lasix  20 mg daily at discharge and weight at the time of discharge was documented at 173 lbs but had been listed at 183 lbs the day prior. Carvedilol  was also increased to 25 mg twice daily while she was continued on Losartan  100 mg daily, Spironolactone  25 mg daily and Simvastatin  20mg  daily. Creatinine was elevated at 2.30 at the time of hospital discharge.  She did have follow-up labs with her PCP on 05/28/2024 and K+ was elevated at 5.9 and creatinine was at 2.98. It is unclear if changes were made at that time. Hgb had improved to 11.2. She did have repeat labs on 06/13/2024 which showed K+ had improved to 4.6 and creatinine was at 2.8. In reviewing her medication list, it appears Losartan  was discontinued by Nephrology earlier this month given her hyperkalemia.  In talking with the patient today, she reports her breathing has been stable since her hospitalization. No recent dyspnea on exertion, chest pain, palpitations, orthopnea, PND or pitting edema. Her main concern is worsening cramping along her hands and legs which has been occurring for the past 2 days. Says that Nephrology put her on a very strict diet and she feels like this is causing her symptoms. Requests labs to be rechecked.   Studies Reviewed:    EKG: EKG is not ordered today.   Echocardiogram: 04/2024 IMPRESSIONS     1. Left ventricular ejection fraction, by estimation, is >75%. The left  ventricle has hyperdynamic function. The left ventricle has no regional  wall motion abnormalities. Left ventricular diastolic parameters are  indeterminate.   2. Right ventricular systolic function is normal. The right ventricular  size is normal. Tricuspid regurgitation signal is inadequate for assessing  PA pressure.   3. The mitral valve is normal in structure. Mild mitral valve  regurgitation.   4. The aortic valve is tricuspid. Aortic valve regurgitation is not  visualized. Aortic valve sclerosis/calcification is present, without any  evidence of aortic stenosis.   5. The inferior vena cava is normal in size with greater than 50%  respiratory variability, suggesting right atrial pressure of 3 mmHg.    Physical Exam:   VS:  BP 132/70   Pulse 66   Ht 5' 4 (1.626 m)   Wt 175 lb (79.4 kg)   SpO2 99%   BMI 30.04 kg/m    Wt Readings from Last 3 Encounters:  06/20/24 175 lb (79.4 kg)  06/04/24 179 lb 8 oz (81.4 kg)  05/28/24 178 lb 1.9 oz (80.8 kg)     GEN: Well nourished, well developed female appearing in no acute distress NECK: No JVD; No carotid bruits CARDIAC: RRR, no murmurs, rubs, gallops RESPIRATORY:  Clear to auscultation without rales, wheezing or rhonchi  ABDOMEN: Appears  non-distended. No obvious abdominal masses. EXTREMITIES: No clubbing or cyanosis. Trace lower extremity edema bilaterally.  Distal pedal pulses are 2+ bilaterally.   Assessment and Plan:   1. Chronic heart failure with preserved ejection fraction (HFpEF) (HCC) - Echocardiogram in 04/2024 showed a hyperdynamic EF greater than 75% with no regional wall motion abnormalities and normal RV function. - She only has trace edema on examination today and lungs are clear. Will defer diuretic dosing to Nephrology in the setting of Stage IV CKD. At this  time, continue current medical therapy with Amlodipine  5 mg daily, Coreg  25mg  BID, Lasix  20 mg daily and Spironolactone  25 mg daily.  Will recheck a BMET and Mg today.   2. Essential hypertension - BP is well-controlled at 132/70 during today's visit. Continue Amlodipine  5 mg daily, Coreg  25 mg twice daily, Lasix  20 mg daily and Spironolactone  25 mg daily. Would not further titrate Amlodipine  given worsening edema with 10 mg dosing in the past.  3. Mixed hyperlipidemia - FLP in 03/2024 showed total cholesterol 190, triglycerides 296, HDL 36 and LDL 103. Remains on Simvastatin  20 mg daily. Would not further titrate given the concurrent use of Amlodipine .  4. Chronic kidney disease, stage 4 (severe) (HCC) - Baseline creatinine 2.0 - 2.1. At 2.30 at the time of hospital discharge but significantly elevated at 2.98 by repeat labs on 8/5 when checked by her PCP. Will recheck BMET and Mg today. She has follow-up with Dr. Rachele scheduled for next week.    Signed, Ellen CHRISTELLA Qua, PA-C

## 2024-06-20 ENCOUNTER — Ambulatory Visit: Payer: Self-pay | Admitting: Adult Health

## 2024-06-20 ENCOUNTER — Ambulatory Visit: Attending: Student | Admitting: Student

## 2024-06-20 ENCOUNTER — Other Ambulatory Visit (HOSPITAL_COMMUNITY)
Admission: RE | Admit: 2024-06-20 | Discharge: 2024-06-20 | Disposition: A | Discharge status: Home / Self Care | Source: Ambulatory Visit | Attending: Student | Admitting: Student

## 2024-06-20 ENCOUNTER — Encounter: Payer: Self-pay | Admitting: Student

## 2024-06-20 ENCOUNTER — Ambulatory Visit: Payer: Self-pay | Admitting: Student

## 2024-06-20 VITALS — BP 132/70 | HR 66 | Ht 64.0 in | Wt 175.0 lb

## 2024-06-20 DIAGNOSIS — I10 Essential (primary) hypertension: Secondary | ICD-10-CM | POA: Diagnosis not present

## 2024-06-20 DIAGNOSIS — E782 Mixed hyperlipidemia: Secondary | ICD-10-CM | POA: Diagnosis not present

## 2024-06-20 DIAGNOSIS — Z79899 Other long term (current) drug therapy: Secondary | ICD-10-CM | POA: Insufficient documentation

## 2024-06-20 DIAGNOSIS — I5032 Chronic diastolic (congestive) heart failure: Secondary | ICD-10-CM

## 2024-06-20 DIAGNOSIS — E875 Hyperkalemia: Secondary | ICD-10-CM | POA: Diagnosis present

## 2024-06-20 DIAGNOSIS — N184 Chronic kidney disease, stage 4 (severe): Secondary | ICD-10-CM

## 2024-06-20 LAB — BASIC METABOLIC PANEL WITH GFR
Anion gap: 13 (ref 5–15)
BUN: 56 mg/dL — ABNORMAL HIGH (ref 8–23)
CO2: 19 mmol/L — ABNORMAL LOW (ref 22–32)
Calcium: 9.5 mg/dL (ref 8.9–10.3)
Chloride: 98 mmol/L (ref 98–111)
Creatinine, Ser: 3.46 mg/dL — ABNORMAL HIGH (ref 0.44–1.00)
GFR, Estimated: 13 mL/min — ABNORMAL LOW (ref >60)
Glucose, Bld: 126 mg/dL — ABNORMAL HIGH (ref 70–99)
Potassium: 4.6 mmol/L (ref 3.5–5.1)
Sodium: 130 mmol/L — ABNORMAL LOW (ref 135–145)

## 2024-06-20 LAB — MAGNESIUM: Magnesium: 2.2 mg/dL (ref 1.7–2.4)

## 2024-06-20 NOTE — Telephone Encounter (Signed)
Pt aware mammogram was negative. Pt voiced understanding. [place]

## 2024-06-20 NOTE — Patient Instructions (Signed)
 Medication Instructions:  Your physician recommends that you continue on your current medications as directed. Please refer to the Current Medication list given to you today.  *If you need a refill on your cardiac medications before your next appointment, please call your pharmacy*  Lab Work: Your physician recommends that you return for lab work in: Today   If you have labs (blood work) drawn today and your tests are completely normal, you will receive your results only by: MyChart Message (if you have MyChart) OR A paper copy in the mail If you have any lab test that is abnormal or we need to change your treatment, we will call you to review the results.  Testing/Procedures: NONE   Follow-Up: At [facility], you and your health needs are our priority.  As part of our continuing mission to provide you with exceptional heart care, our providers are all part of one team.  This team includes your primary Cardiologist (physician) and Advanced Practice Providers or APPs (Physician Assistants and Nurse Practitioners) who all work together to provide you with the care you need, when you need it.  Your next appointment:   6 month(s)  Provider:   You may see Vinie JAYSON Maxcy, MD or one of the following Advanced Practice Providers on your designated Care Team:   Laymon Qua, PA-C  [place] [address], [place] Olivia [place], [place]     We recommend signing up for the patient portal called MyChart.  Sign up information is provided on this After Visit Summary.  MyChart is used to connect with patients for Virtual Visits (Telemedicine).  Patients are able to view lab/test results, encounter notes, upcoming appointments, etc.  Non-urgent messages can be sent to your provider as well.   To learn more about what you can do with MyChart, go to [email or web address].   Other Instructions Thank you for choosing [facility]!

## 2024-06-20 NOTE — Telephone Encounter (Signed)
-----   Message from [place] sent at 06/20/2024  1:50 PM EDT ----- Let her know mammogram was negative THX

## 2024-06-21 ENCOUNTER — Telehealth: Payer: Self-pay

## 2024-06-21 ENCOUNTER — Encounter: Payer: Self-pay | Admitting: Student

## 2024-06-21 NOTE — Telephone Encounter (Signed)
-----   Message from Laymon CHRISTELLA Qua sent at 06/20/2024  9:01 PM EDT ----- Please let the patient know her magnesium and potassium are within a normal range. Sodium is low at 130 and kidney function has worsened as creatinine is now at 3.46. Was 2.8 when checked by Dr.  Rachele on 8/21. I would recommend stopping Spironolactone  given her worsening renal function. Will defer Lasix  dosing to Dr. Rachele and please forward a copy of labs to him as she has an  appointment next week. If her weight acutely changes (> 2 lbs overnight or > 5 lbs in one week) or she develops acutely worsening shortness of breath in the interim, she should proceed to the  Emergency Department.  ----- Message ----- From: [place], Lab In [place] Sent: 06/20/2024   4:36 PM EDT To: Laymon CHRISTELLA Qua, PA-C

## 2024-06-21 NOTE — Telephone Encounter (Signed)
 Pt called back here to ask if her primary care can review her blood sugar levels. I told her I would send a message over to you.

## 2024-06-21 NOTE — Telephone Encounter (Signed)
 Copied from CRM [id number]. Topic: Clinical - Lab/Test Results >> Jun 21, 2024  2:45 PM Debby BROCKS wrote: Reason for CRM: Patient is asking if her PCP Leita Longs was able to check her sugar levels, and if she was if the patient could be notified as to what the levels are

## 2024-06-21 NOTE — Telephone Encounter (Signed)
 The patient has been notified of the result and verbalized understanding.  All questions (if any) were answered. Bernett Dorothyann LABOR, RN 06/21/2024 8:41 AM    Dr.Bhutani  and PCP copied

## 2024-06-25 ENCOUNTER — Ambulatory Visit

## 2024-06-25 ENCOUNTER — Telehealth: Payer: Self-pay

## 2024-06-25 ENCOUNTER — Ambulatory Visit (INDEPENDENT_AMBULATORY_CARE_PROVIDER_SITE_OTHER): Admitting: Acute Care

## 2024-06-25 ENCOUNTER — Encounter: Payer: Self-pay | Admitting: Acute Care

## 2024-06-25 VITALS — BP 166/77 | HR 72 | Temp 97.8°F | Ht 64.0 in | Wt 175.2 lb

## 2024-06-25 DIAGNOSIS — Z87891 Personal history of nicotine dependence: Secondary | ICD-10-CM | POA: Diagnosis not present

## 2024-06-25 DIAGNOSIS — R911 Solitary pulmonary nodule: Secondary | ICD-10-CM | POA: Diagnosis not present

## 2024-06-25 DIAGNOSIS — R9389 Abnormal findings on diagnostic imaging of other specified body structures: Secondary | ICD-10-CM | POA: Diagnosis not present

## 2024-06-25 DIAGNOSIS — R918 Other nonspecific abnormal finding of lung field: Secondary | ICD-10-CM | POA: Diagnosis not present

## 2024-06-25 NOTE — Patient Instructions (Addendum)
 It is good to see you today. Your scan showed that the lung nodule we are following was stable. This is great news.  We will do a 53-month follow-up CT of the chest just to make sure that the nodule remained stable. If you develop any blood when you cough or if you have weight loss that you cannot explain please call us  to be seen sooner. Have a great year. Please contact office for sooner follow up if symptoms do not improve or worsen or seek emergency care

## 2024-06-25 NOTE — Telephone Encounter (Signed)
 Called pt to let her know no answer  Reason for call documented in pts chart

## 2024-06-25 NOTE — Telephone Encounter (Signed)
 Called pt to let her know per laura There is a message from cardiology concerning most recent labs. Ellen Mcmillan hasnt done any labs on her since June.

## 2024-06-25 NOTE — Telephone Encounter (Signed)
 Copied from CRM [id number]. Topic: General - Other >> Jun 25, 2024  1:14 PM Donee H wrote: Reason for CRM: Patient stated was returning missed call fro  Kenzie.  Patient requesting a callback

## 2024-06-25 NOTE — Progress Notes (Signed)
 History of Present Illness Ellen Mcmillan is a 78 y.o. female with history of schizophrenia, diabetes, hyperlipidemia, tension, CKD . She self referred for a 5 mm right middle lobe lung nodule noted on LDCT 01/2024.      06/25/2024 Discussed the use of AI scribe software for clinical note transcription with the patient, who gave verbal consent to proceed.  History of Present Illness Pt. Presents for follow up  to review CT Chest results.  She had a low-dose CT in April 2025 which was read as a lung RADS 2 however there was a small 5 mm lung nodule that she wants followed closely as she is concerned it will start to grow.  We have reviewed the results of her CT chest.The nodules of concern are stable. Radiology do not recommend any further follow up, but the patient is very anxious they will start to grow, even though she is low risk. We will do a 1 year follow up to ensure stability. She denies any breathing issues, no weight loss of hemoptysis. I have asked her to call to be sooner for any unintentional weight loss or blood in her sputum. She verbalized understanding.      Test Results:  CT Chest 06/01/2024  Mediastinum/Nodes: Thoracic inlet is within normal limits. No hilar or mediastinal adenopathy is noted. The esophagus as visualized is within normal limits.   Lungs/Pleura: Lungs are well aerated bilaterally. There again noted nodules within the right middle lobe on image number 62 (4 mm) and image number 64 (2 mm) of series 3. There stable from the prior exam. The more superior nodule represents a Perifissural lymph node. Given their long-term stability, no further follow-up is recommended. No new nodules are seen. Some mild scarring is seen in the bases bilaterally particularly on the right. No focal infiltrate or effusion is seen.  IMPRESSION: Stable pulmonary nodules as described in the right middle lobe. Continue annual screening as previously described.   No other focal  abnormality is noted.     LDCT 01/23/2024 Cardiovascular: No significant vascular findings. Normal heart size. No pericardial effusion. Minimal coronary artery calcifications   Mediastinum/Nodes: No enlarged mediastinal or axillary lymph nodes. Thyroid  gland, trachea, and esophagus demonstrate no significant findings.   Lungs/Pleura: No infiltrates or consolidations   Stable 2 mm nodule in the right middle lobe image 75 and stable 5 mm nodule on image 73. No other pulmonary nodules. Lung-RADS 2, benign appearance or behavior. Continue annual screening with low-dose chest CT without contrast in 12 months.   Chronic basilar scar atelectatic changes stable since prior examination.   No other significant findings   Upper Abdomen: No acute abnormality.   Musculoskeletal: No chest wall mass or suspicious bone lesions identified.   IMPRESSION: *Stable 2 mm nodule in the right middle lobe and stable 5 mm nodule in the right middle lobe. No other pulmonary nodules. Lung-RADS 2, benign appearance or behavior. Continue annual screening with low-dose chest CT without contrast in 12 months. *Chronic basilar scar atelectatic changes stable since prior examination.    Latest Ref Rng & Units 05/28/2024   10:58 AM 05/15/2024    4:22 AM 05/14/2024    4:26 AM  CBC  WBC 3.4 - 10.8 x10E3/uL 7.3  7.2  7.8   Hemoglobin 11.1 - 15.9 g/dL 88.7  9.9  9.2   Hematocrit 34.0 - 46.6 % 33.3  30.6  28.7   Platelets 150 - 450 x10E3/uL 377  282  257  Latest Ref Rng & Units 06/20/2024    4:04 PM 05/28/2024   10:58 AM 05/15/2024    4:22 AM  BMP  Glucose 70 - 99 mg/dL 873  894  881   BUN 8 - 23 mg/dL 56  45  27   Creatinine 0.44 - 1.00 mg/dL 6.53  7.01  7.69   BUN/Creat Ratio 12 - 28  15    Sodium 135 - 145 mmol/L 130  134  139   Potassium 3.5 - 5.1 mmol/L 4.6  5.9  4.3   Chloride 98 - 111 mmol/L 98  98  100   CO2 22 - 32 mmol/L 19  18  27    Calcium  8.9 - 10.3 mg/dL 9.5  89.4  9.2     BNP     Component Value Date/Time   BNP 303.0 (H) 05/13/2024 1955    ProBNP No results found for: PROBNP  PFT No results found for: FEV1PRE, FEV1POST, FVCPRE, FVCPOST, TLC, DLCOUNC, PREFEV1FVCRT, PSTFEV1FVCRT  MM 3D SCREENING MAMMOGRAM BILATERAL BREAST Result Date: 06/20/2024 CLINICAL DATA:  Screening. EXAM: DIGITAL SCREENING BILATERAL MAMMOGRAM WITH TOMOSYNTHESIS AND CAD TECHNIQUE: Bilateral screening digital craniocaudal and mediolateral oblique mammograms were obtained. Bilateral screening digital breast tomosynthesis was performed. The images were evaluated with computer-aided detection. COMPARISON:  Previous exam(s). ACR Breast Density Category c: The breasts are heterogeneously dense, which may obscure small masses. FINDINGS: There are no findings suspicious for malignancy. IMPRESSION: No mammographic evidence of malignancy. A result letter of this screening mammogram will be mailed directly to the patient. RECOMMENDATION: Screening mammogram in one year. (Code:[id number]-01Y) BI-RADS CATEGORY  1: Negative. Electronically Signed   By: Norleen Croak M.D.   On: 06/20/2024 08:15   US  RENAL Result Date: 06/14/2024 CLINICAL DATA:  Chronic renal disease EXAM: RENAL / URINARY TRACT ULTRASOUND COMPLETE COMPARISON:  09/17/2023 CT FINDINGS: Right Kidney: Renal measurements: 5.9 x 1.7 x 3.0 cm = volume: 15.6 mL. Renal atrophy is noted consistent with that seen on prior CTs. Increased echogenicity is noted. Left Kidney: Renal measurements: 11.4 x 4.0 x 4.1 cm. = volume: 98 mL. Echogenicity within normal limits. No mass or hydronephrosis visualized. Bladder: Appears normal for degree of bladder distention. Other: None. IMPRESSION: Right renal atrophy similar to that seen on prior CT. No other focal abnormality is noted. Electronically Signed   By: Oneil Devonshire M.D.   On: 06/14/2024 22:44   CT CHEST WO CONTRAST Result Date: 06/14/2024 CLINICAL DATA:  Follow-up pulmonary nodule EXAM: CT CHEST WITHOUT  CONTRAST TECHNIQUE: Multidetector CT imaging of the chest was performed following the standard protocol without IV contrast. RADIATION DOSE REDUCTION: This exam was performed according to the departmental dose-optimization program which includes automated exposure control, adjustment of the mA and/or kV according to patient size and/or use of iterative reconstruction technique. COMPARISON:  01/23/2024 FINDINGS: Cardiovascular: Somewhat limited due to lack of IV contrast. Atherosclerotic calcifications of the aorta are noted without aneurysmal dilatation. Mild coronary calcifications are seen. Heart is not enlarged size. Mediastinum/Nodes: Thoracic inlet is within normal limits. No hilar or mediastinal adenopathy is noted. The esophagus as visualized is within normal limits. Lungs/Pleura: Lungs are well aerated bilaterally. There again noted nodules within the right middle lobe on image number 62 (4 mm) and image number 64 (2 mm) of series 3. There stable from the prior exam. The more superior nodule represents a Perifissural lymph node. Given their long-term stability, no further follow-up is recommended. No new nodules are seen. Some mild scarring is seen  in the bases bilaterally particularly on the right. No focal infiltrate or effusion is seen. Upper Abdomen: Visualized upper abdomen shows some right renal atrophy of uncertain chronicity. Musculoskeletal: No chest wall mass or suspicious bone lesions identified. IMPRESSION: Stable pulmonary nodules as described in the right middle lobe. Continue annual screening as previously described. No other focal abnormality is noted. Aortic Atherosclerosis (ICD10-I70.0). Electronically Signed   By: Oneil Devonshire M.D.   On: 06/14/2024 22:43     Past medical hx Past Medical History:  Diagnosis Date   CKD (chronic kidney disease), stage IV (HCC) 05/13/2024   Diabetes mellitus without complication (HCC)    HLD (hyperlipidemia)    Hypertension    Ovarian cyst     Schizophrenia (HCC)      Social History   Tobacco Use   Smoking status: Never   Smokeless tobacco: Never   Tobacco comments:    Smoked when she was 27 and only did it for 3 months  Vaping Use   Vaping status: Never Used  Substance Use Topics   Alcohol  use: No   Drug use: No    Ms.Cones reports that she has never smoked. She has never used smokeless tobacco. She reports that she does not drink alcohol  and does not use drugs.  Tobacco Cessation: Counseling given: Not Answered Tobacco comments: Smoked when she was 27 and only did it for 3 months Very remote smoking history  Past surgical hx, Family hx, Social hx all reviewed.  Current Outpatient Medications on File Prior to Visit  Medication Sig   amLODipine  (NORVASC ) 5 MG tablet Take 5 mg by mouth daily.   carvedilol  (COREG ) 25 MG tablet Take 1 tablet (25 mg total) by mouth 2 (two) times daily with a meal.   cetirizine (ZYRTEC) 10 MG tablet Take 10 mg by mouth at bedtime.   fluticasone (FLONASE) 50 MCG/ACT nasal spray Place 2 sprays into both nostrils daily.   furosemide  (LASIX ) 20 MG tablet Take 1 tablet (20 mg total) by mouth daily.   Multiple Vitamin (MULTIVITAMIN) tablet Take 1 tablet by mouth daily.   pantoprazole  (PROTONIX ) 40 MG tablet Take 1 tablet (40 mg total) by mouth daily.   simvastatin  (ZOCOR ) 20 MG tablet Take 1 tablet (20 mg total) by mouth daily.   ziprasidone  (GEODON ) 80 MG capsule Take 1 capsule (80 mg total) by mouth 2 (two) times daily with a meal.   No current facility-administered medications on file prior to visit.     Allergies  Allergen Reactions   Bee Pollen Other (See Comments)    Seasonal allergies    Review Of Systems:  Constitutional:   No  weight loss, night sweats,  Fevers, chills, fatigue, or  lassitude.  HEENT:   No headaches,  Difficulty swallowing,  Tooth/dental problems, or  Sore throat,                No sneezing, itching, ear ache, nasal congestion, post nasal drip,   CV:   No chest pain,  Orthopnea, PND, swelling in lower extremities, anasarca, dizziness, palpitations, syncope.   GI  No heartburn, indigestion, abdominal pain, nausea, vomiting, diarrhea, change in bowel habits, loss of appetite, bloody stools.   Resp: No shortness of breath with exertion or at rest.  No excess mucus, no productive cough,  No non-productive cough,  No coughing up of blood.  No change in color of mucus.  No wheezing.  No chest wall deformity  Skin: no rash or lesions.  GU:  no dysuria, change in color of urine, no urgency or frequency.  No flank pain, no hematuria   MS:  No joint pain or swelling.  No decreased range of motion.  No back pain.  Psych:  No change in mood or affect. No depression or anxiety.  No memory loss.   Vital Signs BP (!) 166/77   Pulse 72   Temp 97.8 F (36.6 C) (Oral)   Ht 5' 4 (1.626 m)   Wt 175 lb 3.2 oz (79.5 kg)   SpO2 99%   BMI 30.07 kg/m    Physical Exam:  General- No distress,  A&Ox3, pleasant ENT: No sinus tenderness, TM clear, pale nasal mucosa, no oral exudate,no post nasal drip, no LAN Cardiac: S1, S2, regular rate and rhythm, no murmur Chest: No wheeze/ rales/ dullness; no accessory muscle use, no nasal flaring, no sternal retractions Abd.: Soft Non-tender, ND, BS +, Body mass index is 30.07 kg/m.  Ext: No clubbing cyanosis, edema, no obvious deformities Neuro:  normal strength, MAE x 4, A&O x 3, appropriate Skin: No rashes, warm and dry, no lesions  Psych: normal mood and behavior   Assessment & Plan Stable Pulmonary Nodules in a remote smoker Plan Your scan showed that the lung nodule we are following was stable. This is great news.  We will do a 86-month follow-up CT of the chest just to make sure that the nodule remained stable. If you develop any blood when you cough or if you have weight loss that you cannot explain please call us  to be seen sooner. Have a great year. Please contact office for sooner follow up if  symptoms do not improve or worsen or seek emergency care     I spent 20 minutes dedicated to the care of this patient on the date of this encounter to include pre-visit review of records, face-to-face time with the patient discussing conditions above, post visit ordering of testing, clinical documentation with the electronic health record, making appropriate referrals as documented, and communicating necessary information to the patient's healthcare team.     Lauraine JULIANNA Lites, NP 06/25/2024  3:02 PM

## 2024-06-26 NOTE — Telephone Encounter (Signed)
No answer from patient 

## 2024-06-27 DIAGNOSIS — N184 Chronic kidney disease, stage 4 (severe): Secondary | ICD-10-CM | POA: Diagnosis not present

## 2024-06-27 DIAGNOSIS — I129 Hypertensive chronic kidney disease with stage 1 through stage 4 chronic kidney disease, or unspecified chronic kidney disease: Secondary | ICD-10-CM | POA: Diagnosis not present

## 2024-06-27 DIAGNOSIS — I5032 Chronic diastolic (congestive) heart failure: Secondary | ICD-10-CM | POA: Diagnosis not present

## 2024-06-27 DIAGNOSIS — N179 Acute kidney failure, unspecified: Secondary | ICD-10-CM | POA: Diagnosis not present

## 2024-06-28 ENCOUNTER — Other Ambulatory Visit: Payer: Self-pay

## 2024-06-28 ENCOUNTER — Ambulatory Visit: Payer: Self-pay

## 2024-06-28 DIAGNOSIS — N184 Chronic kidney disease, stage 4 (severe): Secondary | ICD-10-CM

## 2024-07-01 ENCOUNTER — Encounter: Payer: Self-pay | Admitting: Podiatry

## 2024-07-01 ENCOUNTER — Ambulatory Visit (INDEPENDENT_AMBULATORY_CARE_PROVIDER_SITE_OTHER)

## 2024-07-01 ENCOUNTER — Ambulatory Visit (INDEPENDENT_AMBULATORY_CARE_PROVIDER_SITE_OTHER): Admitting: Podiatry

## 2024-07-01 DIAGNOSIS — M21611 Bunion of right foot: Secondary | ICD-10-CM

## 2024-07-01 DIAGNOSIS — M21619 Bunion of unspecified foot: Secondary | ICD-10-CM

## 2024-07-01 DIAGNOSIS — M25571 Pain in right ankle and joints of right foot: Secondary | ICD-10-CM

## 2024-07-01 NOTE — Progress Notes (Unsigned)
 Chief Complaint  Patient presents with   Foot Pain    Patient would like to discuss surgery on bunion. Pain 8 is not able to wear closed toe shoes only sandals(crocs) ongoing 3 years.  Borderline diabetic A1c 5.9 no anti coag   Laser Treatment   Numbness    .9   HPI: 78 y.o. female presents today for bunion evaluation.  As she would like to proceed with surgical intervention to correct the right bunion.  She states it continues to give her pain and is progressively worsening.  She states most of the pain is with the bump and on the top of the joint.  Past Medical History:  Diagnosis Date   CKD (chronic kidney disease), stage IV (HCC) 05/13/2024   Diabetes mellitus without complication (HCC)    HLD (hyperlipidemia)    Hypertension    Ovarian cyst    Schizophrenia [facility])     Past Surgical History:  Procedure Laterality Date   ABDOMINAL HYSTERECTOMY     BIOPSY  10/24/2023   Procedure: BIOPSY;  Surgeon: Cindie Carlin POUR, DO;  Location: [facility];  Service: Endoscopy;;   ESOPHAGOGASTRODUODENOSCOPY (EGD) WITH PROPOFOL  N/A 10/24/2023   Procedure: ESOPHAGOGASTRODUODENOSCOPY (EGD) WITH PROPOFOL ;  Surgeon: Cindie Carlin POUR, DO;  Location: [facility];  Service: Endoscopy;  Laterality: N/A;  1:45 pm, asa 2   FRACTURE SURGERY Right    ankle   TUBAL LIGATION      Allergies  Allergen Reactions   Bee Pollen Other (See Comments)    Seasonal allergies     PHYSICAL EXAM:  General: The patient is alert and oriented x3 in no acute distress.  Dermatology: Skin is warm, dry and supple bilateral lower extremities. Interspaces are clear of maceration and debris.  No rashes noted.   Vascular: Palpable pedal pulses bilaterally. Capillary refill within normal limits.  No appreciable edema.  No erythema or calor.  Neurological: Light touch sensation grossly intact bilateral feet.   Musculoskeletal Exam:  There is a bony medial prominence on the dorsomedial aspect of the 1st  metatarsal head of the right foot.  There is pain on palpation of the bump in this area as well as the dorsal aspect of the first MPJ..  Lateral deviation of the hallux at the MPJ level.  1st MPJ ROM is decreased.  No crepitus.  Hallux is tracking, not trackbound.    RADIOGRAPHIC EXAM (right foot, 3 weightbearing views, 07/01/2024):  Increased first intermetatarsal angle 15 degrees.  Increased hallux abductus angle.  Uneven joint space narrowing at the first MPJ enlargement of bone at dorsomedial 1st metatarsal head.  Slightly longer second metatarsal.  ASSESSMENT/PLAN OF CARE: 1. Bunion, right foot   2. Pain in joint of right foot     Discussed patient's condition and possible etiologies today.  Conservative measures have been unsuccessful in alleviating her symptoms.  Shoe modification and moving to a larger/wider size has not been effective for her.  She would like to proceed with surgical correction.  Discussed surgical intervention today.  Surgical intervention would consist of a double osteotomy of the first ray with internal fixation.  She states that she would like to proceed with surgery next summer.  States her blood sugars are under good control.  Discussed the surgery in detail today using the x-rays on the screen.  Also discussed the intraoperative and postoperative course with the patient as well as needing 4 to 6 weeks for bone healing to occur.  She will be limited weightbearing and also need to avoid driving.  She notes that she does live alone but she does have someone that can come over to help her but cannot stay with her.  She will need to make arrangements for someone to spend the night with her the first night after surgery.  I discussed the risks involved with proceeding with any surgical correction.  Also discussed possible sequela if the patient does not proceed with any surgical correction of the bunion.  Will hold off on obtaining written consent due to how far out she would  like to have this procedure performed.  Encouraged patient to follow-up in February of next year to begin the surgical planning and review consent forms to have surgery next summer.  Follow-up February 2026 for preop planning  Return in about 5 months (around 12/01/2024) for Surgery consult/preop for bunion surgery.  Awanda CHARM Imperial, DPM, FACFAS [facility]     [address] [place], [place] [address]                Office [phone]  Fax [phone]

## 2024-07-17 DIAGNOSIS — N189 Chronic kidney disease, unspecified: Secondary | ICD-10-CM | POA: Diagnosis not present

## 2024-07-24 ENCOUNTER — Other Ambulatory Visit: Payer: Self-pay

## 2024-07-24 ENCOUNTER — Telehealth: Payer: Self-pay

## 2024-07-24 DIAGNOSIS — I152 Hypertension secondary to endocrine disorders: Secondary | ICD-10-CM

## 2024-07-24 MED ORDER — BLOOD PRESSURE KIT: 1.0000 [IU] | PACK | Freq: Every day | 0 refills | Status: AC | PRN

## 2024-07-24 NOTE — Telephone Encounter (Signed)
 Copied from CRM [id number]. Topic: Clinical - Medication Question >> Jul 24, 2024 11:14 AM Ellen Mcmillan wrote: Reason for CRM: Patient would like a prescription written out for a blood pressure cuff//[place] [facility] - [place], [place] - [address] [place] [address] Phone: [phone] Fax: [phone] Hours: Not open 24 hours  Please call the patient once prescription is sent//

## 2024-07-25 NOTE — Telephone Encounter (Signed)
 Left pt detailed message okay to do per [facility]

## 2024-08-03 ENCOUNTER — Emergency Department (HOSPITAL_COMMUNITY)

## 2024-08-03 ENCOUNTER — Encounter (HOSPITAL_COMMUNITY): Payer: Self-pay | Admitting: Emergency Medicine

## 2024-08-03 ENCOUNTER — Other Ambulatory Visit: Payer: Self-pay

## 2024-08-03 ENCOUNTER — Emergency Department (HOSPITAL_COMMUNITY)
Admission: EM | Admit: 2024-08-03 | Discharge: 2024-08-03 | Disposition: A | Discharge status: Home / Self Care | Attending: Emergency Medicine | Admitting: Emergency Medicine

## 2024-08-03 DIAGNOSIS — N189 Chronic kidney disease, unspecified: Secondary | ICD-10-CM | POA: Diagnosis not present

## 2024-08-03 DIAGNOSIS — I509 Heart failure, unspecified: Secondary | ICD-10-CM | POA: Diagnosis not present

## 2024-08-03 DIAGNOSIS — K120 Recurrent oral aphthae: Secondary | ICD-10-CM | POA: Insufficient documentation

## 2024-08-03 DIAGNOSIS — E1122 Type 2 diabetes mellitus with diabetic chronic kidney disease: Secondary | ICD-10-CM | POA: Insufficient documentation

## 2024-08-03 DIAGNOSIS — M25551 Pain in right hip: Secondary | ICD-10-CM | POA: Diagnosis not present

## 2024-08-03 DIAGNOSIS — I130 Hypertensive heart and chronic kidney disease with heart failure and stage 1 through stage 4 chronic kidney disease, or unspecified chronic kidney disease: Secondary | ICD-10-CM | POA: Insufficient documentation

## 2024-08-03 DIAGNOSIS — M549 Dorsalgia, unspecified: Secondary | ICD-10-CM | POA: Diagnosis not present

## 2024-08-03 DIAGNOSIS — I10 Essential (primary) hypertension: Secondary | ICD-10-CM | POA: Diagnosis not present

## 2024-08-03 LAB — CBC WITH DIFFERENTIAL/PLATELET
Abs Immature Granulocytes: 0.06 K/uL (ref 0.00–0.07)
Basophils Absolute: 0.1 K/uL (ref 0.0–0.1)
Basophils Relative: 1 %
Eosinophils Absolute: 0.1 K/uL (ref 0.0–0.5)
Eosinophils Relative: 2 %
HCT: 35.2 % — ABNORMAL LOW (ref 36.0–46.0)
Hemoglobin: 11.1 g/dL — ABNORMAL LOW (ref 12.0–15.0)
Immature Granulocytes: 1 %
Lymphocytes Relative: 37 %
Lymphs Abs: 3.1 K/uL (ref 0.7–4.0)
MCH: 32.2 pg (ref 26.0–34.0)
MCHC: 31.5 g/dL (ref 30.0–36.0)
MCV: 102 fL — ABNORMAL HIGH (ref 80.0–100.0)
Monocytes Absolute: 0.8 K/uL (ref 0.1–1.0)
Monocytes Relative: 9 %
Neutro Abs: 4.2 K/uL (ref 1.7–7.7)
Neutrophils Relative %: 50 %
Platelets: 218 K/uL (ref 150–400)
RBC: 3.45 MIL/uL — ABNORMAL LOW (ref 3.87–5.11)
RDW: 12.5 % (ref 11.5–15.5)
Smear Review: NORMAL
WBC: 8.3 K/uL (ref 4.0–10.5)
nRBC: 0 % (ref 0.0–0.2)

## 2024-08-03 LAB — COMPREHENSIVE METABOLIC PANEL WITH GFR
ALT: 22 U/L (ref 0–44)
AST: 28 U/L (ref 15–41)
Albumin: 4 g/dL (ref 3.5–5.0)
Alkaline Phosphatase: 107 U/L (ref 38–126)
Anion gap: 15 (ref 5–15)
BUN: 32 mg/dL — ABNORMAL HIGH (ref 8–23)
CO2: 26 mmol/L (ref 22–32)
Calcium: 10 mg/dL (ref 8.9–10.3)
Chloride: 101 mmol/L (ref 98–111)
Creatinine, Ser: 2.08 mg/dL — ABNORMAL HIGH (ref 0.44–1.00)
GFR, Estimated: 24 mL/min — ABNORMAL LOW (ref >60)
Glucose, Bld: 111 mg/dL — ABNORMAL HIGH (ref 70–99)
Potassium: 4.3 mmol/L (ref 3.5–5.1)
Sodium: 142 mmol/L (ref 135–145)
Total Bilirubin: 0.3 mg/dL (ref 0.0–1.2)
Total Protein: 7.8 g/dL (ref 6.5–8.1)

## 2024-08-03 LAB — URINALYSIS, ROUTINE W REFLEX MICROSCOPIC
Bilirubin Urine: NEGATIVE
Glucose, UA: NEGATIVE mg/dL
Hgb urine dipstick: NEGATIVE
Ketones, ur: NEGATIVE mg/dL
Leukocytes,Ua: NEGATIVE
Nitrite: NEGATIVE
Protein, ur: NEGATIVE mg/dL
Specific Gravity, Urine: 1.004 — ABNORMAL LOW (ref 1.005–1.030)
pH: 7 (ref 5.0–8.0)

## 2024-08-03 MED ORDER — MAGIC MOUTHWASH W/LIDOCAINE
5.0000 mL | Freq: Once | ORAL | Status: AC
  Administered 2024-08-03: 5 mL via ORAL
  Filled 2024-08-03: qty 5

## 2024-08-03 MED ORDER — MAGIC MOUTHWASH W/LIDOCAINE
10.0000 mL | Freq: Once | ORAL | Status: DC
  Filled 2024-08-03: qty 10

## 2024-08-03 MED ORDER — LIDOCAINE 5 % EX PTCH
1.0000 | MEDICATED_PATCH | CUTANEOUS | 0 refills | Status: DC
Start: 2024-08-03 — End: 2024-08-05

## 2024-08-03 MED ORDER — LIDOCAINE 5 % EX PTCH
1.0000 | MEDICATED_PATCH | CUTANEOUS | Status: DC
  Administered 2024-08-03: 1 via TRANSDERMAL
  Filled 2024-08-03: qty 1

## 2024-08-03 MED ORDER — LIDOCAINE VISCOUS HCL 2 % MT SOLN: 10.0000 mL | Freq: Three times a day (TID) | OROMUCOSAL | 0 refills | Status: AC

## 2024-08-03 NOTE — ED Provider Notes (Signed)
 [place] EMERGENCY DEPARTMENT AT [facility] Provider Note   CSN: [id number] Arrival date & time: 08/03/24  1015     Patient presents with: Back Pain (Lower right radiating into hip)   Ellen Mcmillan is a 78 y.o. female.  This schizophrenia, history of GERD, diabetes, hyperlipidemia, HFpEF, stage IV CKD.  Presents to ER complaining of left lateral hip pain that starts in the lateral hip and radiates to the mid thigh.  Denies injury or trauma has been going on for about 2-1/2 weeks.  Denies any back pain, denies fever or chills denies saddle anesthesia or paresthesia, no bowel or bladder incontinence.  No urinary symptoms.  She has been taking Tylenol  without significant relief.  She states she also wants us  to evaluate her pain on the roof of her mouth, she states there is soreness and has been there for over a week.  No trouble swallowing or breathing, no sore throat.  Patient reports that her hard palate is the source of the pain.  {Add pertinent medical, surgical, social history, OB history to HPI:32947}  Back Pain      Prior to Admission medications   Medication Sig Start Date End Date Taking? Authorizing Provider  amLODipine  (NORVASC ) 5 MG tablet Take 5 mg by mouth daily.    [provider]  Blood Pressure KIT 1 Units by Does not apply route daily as needed. 07/24/24   Bevely Doffing, FNP  carvedilol  (COREG ) 25 MG tablet Take 1 tablet (25 mg total) by mouth 2 (two) times daily with a meal. 05/15/24   Emokpae, Courage, MD  cetirizine (ZYRTEC) 10 MG tablet Take 10 mg by mouth at bedtime. 07/25/22   [provider]  fluticasone (FLONASE) 50 MCG/ACT nasal spray Place 2 sprays into both nostrils daily. 05/16/24   [provider]  furosemide  (LASIX ) 20 MG tablet Take 1 tablet (20 mg total) by mouth daily. 05/28/24   Bevely Doffing, FNP  Multiple Vitamin (MULTIVITAMIN) tablet Take 1 tablet by mouth daily.    [provider]  pantoprazole   (PROTONIX ) 40 MG tablet Take 1 tablet (40 mg total) by mouth daily. 05/15/24 05/15/25  Pearlean Manus, MD  simvastatin  (ZOCOR ) 20 MG tablet Take 1 tablet (20 mg total) by mouth daily. 03/25/24   Bevely Doffing, FNP  ziprasidone  (GEODON ) 80 MG capsule Take 1 capsule (80 mg total) by mouth 2 (two) times daily with a meal. 06/13/24   Bevely Doffing, FNP    Allergies: Bee pollen    Review of Systems  Musculoskeletal:  Positive for back pain.    Updated Vital Signs BP (!) 202/73 (BP Location: Left Arm)   Pulse 74   Temp (!) 97.3 F (36.3 C) (Oral)   Resp 18   Ht 5' 4 (1.626 m)   Wt 81.6 kg   SpO2 97%   BMI 30.90 kg/m   Physical Exam Vitals and nursing note reviewed.  Constitutional:      General: She is not in acute distress.    Appearance: She is well-developed.  HENT:     Head: Normocephalic and atraumatic.     Mouth/Throat:     Dentition: No dental tenderness or gingival swelling.     Tongue: No lesions.     Pharynx: Uvula midline. No pharyngeal swelling, oropharyngeal exudate, posterior oropharyngeal erythema or uvula swelling.      Comments: Patient has torus palatinus, this is erythematous with some mild ulceration at the center.  No drainage or exudates, no posterior  pharyngeal erythema or swelling Eyes:     Conjunctiva/sclera: Conjunctivae normal.  Cardiovascular:     Rate and Rhythm: Normal rate and regular rhythm.     Heart sounds: No murmur heard. Pulmonary:     Effort: Pulmonary effort is normal. No respiratory distress.     Breath sounds: Normal breath sounds.  Abdominal:     Palpations: Abdomen is soft.     Tenderness: There is no abdominal tenderness.  Musculoskeletal:        General: No swelling.     Cervical back: Neck supple.  Skin:    General: Skin is warm and dry.     Capillary Refill: Capillary refill takes less than 2 seconds.  Neurological:     General: No focal deficit present.     Mental Status: She is alert and oriented to person, place, and  time.  Psychiatric:        Mood and Affect: Mood normal.     (all labs ordered are listed, but only abnormal results are displayed) Labs Reviewed  CBC WITH DIFFERENTIAL/PLATELET  COMPREHENSIVE METABOLIC PANEL WITH GFR  URINALYSIS, ROUTINE W REFLEX MICROSCOPIC    EKG: None  Radiology: No results found.  {Document cardiac monitor, telemetry assessment procedure when appropriate:32947} Procedures   Medications Ordered in the ED  magic mouthwash w/lidocaine  (has no administration in time range)  lidocaine  (LIDODERM ) 5 % 1 patch (has no administration in time range)      {Click here for ABCD2, HEART and other calculators REFRESH Note before signing:1}                              Medical Decision Making This patient presents to the ED for concern of ***, this involves an extensive number of treatment options, and is a complaint that carries with it a high risk of complications and morbidity.  The differential diagnosis includes ***   Co morbidities that complicate the patient evaluation :   ***   Additional history obtained:  Additional history obtained from *** External records from outside source obtained and reviewed including ***   Lab Tests:  I Ordered, and personally interpreted labs.  The pertinent results include:  ***    Imaging Studies ordered:  I ordered imaging studies including *** which shows *** I independently visualized and interpreted imaging within scope of identifying emergent findings  I agree with the radiologist interpretation   Cardiac Monitoring: / EKG:  The patient was maintained on a cardiac monitor.  I personally viewed and interpreted the cardiac monitored which showed an underlying rhythm of: ***   Consultations Obtained:  I requested consultation with the ***,  and discussed lab and imaging findings as well as pertinent plan - they recommend: ***   Problem List / ED Course / Critical interventions / Medication  management  *** I ordered medication including ***  for ***  Reevaluation of the patient after these medicines showed that the patient {resolved/improved/worsened:23923::improved} I have reviewed the patients home medicines and have made adjustments as needed   Social Determinants of Health:  ***   Test / Admission - Considered:  ***    Amount and/or Complexity of Data Reviewed Labs: ordered. Radiology: ordered.  Risk Prescription drug management.   ***  {Document critical care time when appropriate  Document review of labs and clinical decision tools ie CHADS2VASC2, etc  Document your independent review of radiology images and any outside records  Document  your discussion with family members, caretakers and with consultants  Document social determinants of health affecting pt's care  Document your decision making why or why not admission, treatments were needed:32947:::1}   Final diagnoses:  None    ED Discharge Orders     None

## 2024-08-03 NOTE — Discharge Instructions (Addendum)
 Pleasure taking care of you today.  You are seen in the ER for right hip pain.  Your x-ray did not show any acute abnormalities.  Your blood work was all stable, you do not have a UTI.  You can use lidocaine  patches and continue using over-the-counter Tylenol  as directed on the packaging and follow-up with your PCP.  If you do not have improvement and follow-up with orthopedics as listed.  You also have ulcer on the top of your bilateral foot, you can use the Magic mouthwash as directed to help with the discomfort.  Avoid spicy and salty foods until this is healed.  Follow-up with PCP and/your dentist.

## 2024-08-03 NOTE — ED Notes (Signed)
 Pt/family received d/c paperwork at this time. After going over the paperwork any questions, comments, or concerns were answered to the best of this nurse's knowledge. The pt/family verbally acknowledged the teachings/instructions.

## 2024-08-03 NOTE — ED Triage Notes (Signed)
 Pt bib [facility] w/ c/o lower right back pain that radiates into her right hip x 2.5 weeks. Pt denies any injury. Pt is also c/o swelling and pain to her epiglottis. Pt is hypertensive in triage

## 2024-08-05 ENCOUNTER — Ambulatory Visit

## 2024-08-05 ENCOUNTER — Other Ambulatory Visit (HOSPITAL_COMMUNITY): Payer: Self-pay

## 2024-08-05 VITALS — BP 140/78 | Resp 16 | Ht 64.0 in | Wt 175.0 lb

## 2024-08-05 DIAGNOSIS — N184 Chronic kidney disease, stage 4 (severe): Secondary | ICD-10-CM | POA: Diagnosis not present

## 2024-08-05 DIAGNOSIS — G8929 Other chronic pain: Secondary | ICD-10-CM

## 2024-08-05 DIAGNOSIS — I151 Hypertension secondary to other renal disorders: Secondary | ICD-10-CM

## 2024-08-05 DIAGNOSIS — M5441 Lumbago with sciatica, right side: Secondary | ICD-10-CM

## 2024-08-05 MED ORDER — BLOOD PRESSURE KIT DEVI
0 refills | Status: AC
Start: 2024-08-05 — End: ?

## 2024-08-05 MED ORDER — LIDOCAINE 5 % EX PTCH: 1.0000 | MEDICATED_PATCH | CUTANEOUS | 5 refills | Status: DC

## 2024-08-05 NOTE — Progress Notes (Signed)
 Established Patient Office Visit  Subjective   Patient ID: Ellen Mcmillan, female    DOB: April 26, 1946  Age: 78 y.o. MRN: [id number]  Chief Complaint  Patient presents with   ER follow up    Went to ER 10/11 for right hip pain. Wanting a rx for lidocaine  patches to help    Referral    Wants referral to a dietitian    Medication Refill    Wants a rx for a blood pressure cuff sent to [facility]     HPI Seen at [facility] ER on 08/03/2024  Ellen Mcmillan is a 78 y.o. female.  This schizophrenia, history of GERD, diabetes, hyperlipidemia, HFpEF, stage IV CKD.  Presents to ER complaining of left lateral hip pain that starts in the lateral hip and radiates to the mid thigh.  Denies injury or trauma has been going on for about 2-1/2 weeks.  Denies any back pain, denies fever or chills denies saddle anesthesia or paresthesia, no bowel or bladder incontinence.  No urinary symptoms.  She has been taking Tylenol  without significant relief.  She states she also wants us  to evaluate her pain on the roof of her mouth, she states there is soreness and has been there for over a week.  No trouble swallowing or breathing, no sore throat.  Patient reports that her hard palate is the source of the pain.   Patient Active Problem List   Diagnosis Date Noted   Chronic midline low back pain with right-sided sciatica 08/10/2024   Screening mammogram for breast cancer 06/04/2024   S/P hysterectomy 06/04/2024   Encounter for well woman exam with routine gynecological exam 06/04/2024   Aplastic anemia 05/28/2024   Acute on chronic heart failure with preserved ejection fraction (HFpEF) (HCC) 05/13/2024   Chronic kidney disease, stage 4 (severe) (HCC) 05/13/2024   Gastroesophageal reflux disease without esophagitis 03/26/2024   Hyperlipidemia associated with type 2 diabetes mellitus (HCC) 03/26/2024   Hypertension 03/26/2024   Schizophrenia (HCC) 03/25/2024   Duodenal ulcer 12/05/2023   Constipation  12/05/2023   Pulmonary nodules 12/05/2023   Cough 12/05/2023   Abnormal CT scan, small bowel 09/19/2023   Upper abdominal pain 09/19/2023   Pain due to onychomycosis of toenails of both feet 10/28/2022      ROS    Objective:     BP (!) 140/78 (BP Location: Left Arm, Patient Position: Sitting, Cuff Size: Normal)   Resp 16   Ht 5' 4 (1.626 m)   Wt 175 lb (79.4 kg)   SpO2 95%   BMI 30.04 kg/m  BP Readings from Last 3 Encounters:  08/05/24 (!) 140/78  08/03/24 (!) 156/59  06/25/24 (!) 166/77   Wt Readings from Last 3 Encounters:  08/05/24 175 lb (79.4 kg)  08/03/24 180 lb (81.6 kg)  06/25/24 175 lb 3.2 oz (79.5 kg)     Physical Exam Vitals and nursing note reviewed.  Constitutional:      Appearance: Normal appearance.  HENT:     Head: Normocephalic.  Eyes:     Extraocular Movements: Extraocular movements intact.     Pupils: Pupils are equal, round, and reactive to light.  Cardiovascular:     Rate and Rhythm: Normal rate and regular rhythm.  Pulmonary:     Effort: Pulmonary effort is normal.     Breath sounds: Normal breath sounds.  Musculoskeletal:     Cervical back: Normal range of motion and neck supple.  Neurological:     Mental Status:  She is alert and oriented to person, place, and time.  Psychiatric:        Mood and Affect: Mood normal.        Thought Content: Thought content normal.     No results found for any visits on 08/05/24.    The 10-year ASCVD risk score (Arnett DK, et al., 2019) is: 31.2%    Assessment & Plan:   Problem List Items Addressed This Visit       Cardiovascular and Mediastinum   Hypertension   Stable with current medication.  Continue with low-sodium diet and avoidance of NSAIDS.  Continue to f/u with nephrology, Dr. Rachele, as scheduled.        Relevant Medications   Blood Pressure Monitoring (BLOOD PRESSURE KIT) DEVI   Other Relevant Orders   Ambulatory referral to diabetic education     Nervous and Auditory    Chronic midline low back pain with right-sided sciatica - Primary   Will refill Lidoderm  patches for pain management.        Relevant Medications   lidocaine  (LIDODERM ) 5 %     Genitourinary   Chronic kidney disease, stage 4 (severe) (HCC)   Will refer to diabetic education to counsel her on diabetic diet.  Continue to f/u with nephrology as scheduled.        Relevant Orders   Ambulatory referral to diabetic education    No follow-ups on file.    Leita Longs, FNP

## 2024-08-06 ENCOUNTER — Other Ambulatory Visit (HOSPITAL_COMMUNITY): Payer: Self-pay

## 2024-08-10 DIAGNOSIS — G8929 Other chronic pain: Secondary | ICD-10-CM | POA: Insufficient documentation

## 2024-08-10 NOTE — Assessment & Plan Note (Signed)
 Stable with current medication.  Continue with low-sodium diet and avoidance of NSAIDS.  Continue to f/u with nephrology, Dr. Rachele, as scheduled.

## 2024-08-10 NOTE — Assessment & Plan Note (Signed)
 Will refer to diabetic education to counsel her on diabetic diet.  Continue to f/u with nephrology as scheduled.

## 2024-08-10 NOTE — Assessment & Plan Note (Signed)
 Will refill Lidoderm  patches for pain management.

## 2024-08-12 ENCOUNTER — Other Ambulatory Visit (HOSPITAL_COMMUNITY): Payer: Self-pay

## 2024-08-12 ENCOUNTER — Telehealth: Payer: Self-pay | Admitting: Pharmacy Technician

## 2024-08-12 NOTE — Telephone Encounter (Signed)
 Pharmacy Patient Advocate Encounter   Received notification from CoverMyMeds that prior authorization for Lidocaine  5% patches is required/requested.   Insurance verification completed.   The patient is insured through [employer].   Per test claim: PA required; PA submitted to above mentioned insurance via Latent Key/confirmation #/EOC [id number] Status is pending

## 2024-08-12 NOTE — Telephone Encounter (Signed)
 Pharmacy Patient Advocate Encounter  Received notification from [facility] that Prior Authorization for Lidocaine  5% patches has been APPROVED from 08/12/2024 to 10/23/2025. Unable to obtain price due to refill too soon rejection, last fill date 08/12/2024 next available fill date11/09/2024.   PA #/Case ID/Reference #: [id number]

## 2024-08-13 ENCOUNTER — Other Ambulatory Visit: Payer: Self-pay

## 2024-08-13 ENCOUNTER — Telehealth: Payer: Self-pay

## 2024-08-13 ENCOUNTER — Ambulatory Visit (INDEPENDENT_AMBULATORY_CARE_PROVIDER_SITE_OTHER)

## 2024-08-13 VITALS — BP 130/80 | Ht 64.0 in | Wt 174.0 lb

## 2024-08-13 DIAGNOSIS — Z0000 Encounter for general adult medical examination without abnormal findings: Secondary | ICD-10-CM

## 2024-08-13 DIAGNOSIS — Z780 Asymptomatic menopausal state: Secondary | ICD-10-CM

## 2024-08-13 DIAGNOSIS — R233 Spontaneous ecchymoses: Secondary | ICD-10-CM

## 2024-08-13 NOTE — Telephone Encounter (Signed)
 Referral placed.

## 2024-08-13 NOTE — Progress Notes (Signed)
 Patient is due for: Diabetic kidney evaluation-urine ACR + Hep C labs Dexa Scan ordered today and patient given phone number to call and schedule screening.  Subjective:   Ellen Mcmillan is a 78 y.o. who presents for a Medicare Wellness preventive visit.  As a reminder, Annual Wellness Visits don't include a physical exam, and some assessments may be limited, especially if this visit is performed virtually. We may recommend an in-person follow-up visit with your provider if needed.  Visit Complete: Virtual I connected with  Ellen Mcmillan on 08/13/24 by a audio enabled telemedicine application and verified that I am speaking with the correct person using two identifiers.  Patient Location: Home  Provider Location: Home Office  I discussed the limitations of evaluation and management by telemedicine. The patient expressed understanding and agreed to proceed.  Vital Signs: Because this visit was a virtual/telehealth visit, some criteria may be missing or patient reported. Any vitals not documented were not able to be obtained and vitals that have been documented are patient reported.  VideoDeclined- This patient declined Librarian, academic. Therefore the visit was completed with audio only.  Persons Participating in Visit: Patient.  AWV Questionnaire: No: Patient Medicare AWV questionnaire was not completed prior to this visit.  Cardiac Risk Factors include: advanced age (>49men, >64 women);diabetes mellitus;dyslipidemia;hypertension;Other (see comment), Risk factor comments: CHF     Objective:    Today's Vitals   08/13/24 0807  BP: 130/80  Weight: 174 lb (78.9 kg)  Height: 5' 4 (1.626 m)  PainSc: 0-No pain   Body mass index is 29.87 kg/m.     08/13/2024    8:04 AM 08/03/2024   10:53 AM 05/13/2024   11:58 PM 05/13/2024   10:10 PM 05/13/2024    7:13 PM 04/24/2024    5:24 PM 10/24/2023   12:46 PM  Advanced Directives  Does Patient Have a  Medical Advance Directive? No No  No No No No  Would patient like information on creating a medical advance directive? No - Patient declined No - Patient declined No - Patient declined   No - Patient declined Yes (MAU/Ambulatory/Procedural Areas - Information given)    Current Medications (verified) Outpatient Encounter Medications as of 08/13/2024  Medication Sig   amLODipine  (NORVASC ) 5 MG tablet Take 5 mg by mouth daily.   Blood Pressure KIT 1 Units by Does not apply route daily as needed.   Blood Pressure Monitoring (BLOOD PRESSURE KIT) DEVI Use to monitor blood pressure daily DX I50.33   carvedilol  (COREG ) 25 MG tablet Take 1 tablet (25 mg total) by mouth 2 (two) times daily with a meal.   cetirizine (ZYRTEC) 10 MG tablet Take 10 mg by mouth at bedtime.   fluticasone (FLONASE) 50 MCG/ACT nasal spray Place 2 sprays into both nostrils daily.   furosemide  (LASIX ) 20 MG tablet Take 1 tablet (20 mg total) by mouth daily.   lidocaine  (LIDODERM ) 5 % Place 1 patch onto the skin daily. Remove & Discard patch within 12 hours or as directed by MD   magic mouthwash (lidocaine , diphenhydrAMINE, alum & mag hydroxide) suspension Swish and spit 10 mLs 3 (three) times daily.   Multiple Vitamin (MULTIVITAMIN) tablet Take 1 tablet by mouth daily.   pantoprazole  (PROTONIX ) 40 MG tablet Take 1 tablet (40 mg total) by mouth daily.   simvastatin  (ZOCOR ) 20 MG tablet Take 1 tablet (20 mg total) by mouth daily.   ziprasidone  (GEODON ) 80 MG capsule Take 1 capsule (80 mg  total) by mouth 2 (two) times daily with a meal.   No facility-administered encounter medications on file as of 08/13/2024.    Allergies (verified) Bee pollen   History: Past Medical History:  Diagnosis Date   CKD (chronic kidney disease), stage IV (HCC) 05/13/2024   Diabetes mellitus without complication (HCC)    HLD (hyperlipidemia)    Hypertension    Ovarian cyst    Schizophrenia [facility])    Past Surgical History:  Procedure  Laterality Date   ABDOMINAL HYSTERECTOMY     BIOPSY  10/24/2023   Procedure: BIOPSY;  Surgeon: Cindie Carlin POUR, DO;  Location: [facility];  Service: Endoscopy;;   ESOPHAGOGASTRODUODENOSCOPY (EGD) WITH PROPOFOL  N/A 10/24/2023   Procedure: ESOPHAGOGASTRODUODENOSCOPY (EGD) WITH PROPOFOL ;  Surgeon: Cindie Carlin POUR, DO;  Location: [facility];  Service: Endoscopy;  Laterality: N/A;  1:45 pm, asa 2   FRACTURE SURGERY Right    ankle   TUBAL LIGATION     Family History  Problem Relation Age of Onset   Hypertension Father    Cancer Father        lung   Hypertension Mother    Kidney failure Mother 59   Stroke Mother    Stroke Brother    Hypertension Brother    Hypertension Sister    Other Sister        blood clot   Mental illness Sister    Colon cancer Sister    Mental illness Other    Cancer Other    Stroke Other    Other Son        cardiac arrest   Social History   Socioeconomic History   Marital status: Widowed    Spouse name: Not on file   Number of children: Not on file   Years of education: Not on file   Highest education level: Not on file  Occupational History   Not on file  Tobacco Use   Smoking status: Never   Smokeless tobacco: Never   Tobacco comments:    Smoked when she was 27 and only did it for 3 months  Vaping Use   Vaping status: Never Used  Substance and Sexual Activity   Alcohol  use: No   Drug use: No   Sexual activity: Not Currently    Birth control/protection: Surgical    Comment: hyst  Other Topics Concern   Not on file  Social History Narrative   Not on file   Social Drivers of Health   Financial Resource Strain: Low Risk  (08/13/2024)   Overall Financial Resource Strain (CARDIA)    Difficulty of Paying Living Expenses: Not hard at all  Food Insecurity: No Food Insecurity (08/13/2024)   Hunger Vital Sign    Worried About Running Out of Food in the Last Year: Never true    Ran Out of Food in the Last Year: Never true   Transportation Needs: No Transportation Needs (08/13/2024)   PRAPARE - Administrator, Civil Service (Medical): No    Lack of Transportation (Non-Medical): No  Physical Activity: Sufficiently Active (08/13/2024)   Exercise Vital Sign    Days of Exercise per Week: 7 days    Minutes of Exercise per Session: 30 min  Recent Concern: Physical Activity - Insufficiently Active (06/04/2024)   Exercise Vital Sign    Days of Exercise per Week: 2 days    Minutes of Exercise per Session: 20 min  Stress: No Stress Concern Present (08/13/2024)   [employer]  of Occupational Health - Occupational Stress Questionnaire    Feeling of Stress: Not at all  Social Connections: Moderately Integrated (08/13/2024)   Social Connection and Isolation Panel    Frequency of Communication with Friends and Family: More than three times a week    Frequency of Social Gatherings with Friends and Family: Three times a week    Attends Religious Services: More than 4 times per year    Active Member of Clubs or Organizations: Yes    Attends Banker Meetings: More than 4 times per year    Marital Status: Widowed    Tobacco Counseling Counseling given: Yes Tobacco comments: Smoked when she was 27 and only did it for 3 months    Clinical Intake:  Pre-visit preparation completed: Yes  Pain : No/denies pain Pain Score: 0-No pain     BMI - recorded: 29.87 Nutritional Status: BMI 25 -29 Overweight Nutritional Risks: None Diabetes: No  Lab Results  Component Value Date   HGBA1C 5.9 (H) 03/25/2024     How often do you need to have someone help you when you read instructions, pamphlets, or other written materials from your doctor or pharmacy?: 1 - Never  Interpreter Needed?: No  Information entered by :: Jeanett Antonopoulos W CMA (AAMA)   Activities of Daily Living     08/13/2024    8:16 AM 05/14/2024   12:00 AM  In your present state of health, do you have any difficulty performing the  following activities:  Hearing? 0 0  Vision? 0 0  Difficulty concentrating or making decisions? 0 0  Walking or climbing stairs? 0   Dressing or bathing? 0   Doing errands, shopping? 0   Preparing Food and eating ? N   Using the Toilet? N   In the past six months, have you accidently leaked urine? N   Do you have problems with loss of bowel control? N   Managing your Medications? N   Managing your Finances? N   Housekeeping or managing your Housekeeping? N     Patient Care Team: Bevely Doffing, FNP as PCP - General (Family Medicine) Shaaron Lamar HERO, MD as Consulting Physician (Gastroenterology) Mona Vinie BROCKS, MD as Consulting Physician (Cardiology)  I have updated your Care Teams any recent Medical Services you may have received from other providers in the past year.     Assessment:   This is a routine wellness examination for [place].  Hearing/Vision screen Hearing Screening - Comments:: Patient denies any hearing difficulties.   Vision Screening - Comments:: Wears rx glasses - up to date with routine eye exams with  Robynn Mill w/ [facility]   Goals Addressed               This Visit's Progress     Remain active and healthy (pt-stated)          Depression Screen     08/13/2024    8:17 AM 08/05/2024    3:03 PM 06/04/2024    9:20 AM 05/08/2024    3:32 PM 03/25/2024    1:11 PM  PHQ 2/9 Scores  PHQ - 2 Score 0 0 0 0 0  PHQ- 9 Score 0  2 0 2     Fall Risk     08/13/2024    8:13 AM 06/04/2024    9:16 AM 05/08/2024    3:32 PM 03/25/2024    1:11 PM 05/23/2019    6:50 PM  Fall Risk   Falls  in the past year? 0 1 1 0 --   Comment     Emmi Telephone Survey: data to providers prior to load   Number falls in past yr: 0 0 0 0 --   Comment     Emmi Telephone Survey Actual Response =    Injury with Fall? 0 1 0 0   Risk for fall due to : No Fall Risks  History of fall(s) No Fall Risks   Follow up Falls evaluation completed;Education provided;Falls prevention  discussed  Falls evaluation completed Falls evaluation completed      Data saved with a previous flowsheet row definition    MEDICARE RISK AT HOME:  Medicare Risk at Home Any stairs in or around the home?: Yes If so, are there any without handrails?: No Home free of loose throw rugs in walkways, pet beds, electrical cords, etc?: Yes Adequate lighting in your home to reduce risk of falls?: Yes Life alert?: No Use of a cane, walker or w/c?: No Grab bars in the bathroom?: Yes Shower chair or bench in shower?: Yes Elevated toilet seat or a handicapped toilet?: Yes  TIMED UP AND GO:  Was the test performed?  No  Cognitive Function: 6CIT completed        08/13/2024    8:17 AM  6CIT Screen  What Year? 0 points  What month? 0 points  What time? 0 points  Count back from 20 0 points  Months in reverse 0 points  Repeat phrase 0 points  Total Score 0 points    Immunizations Immunization History  Administered Date(s) Administered   Influenza-Unspecified 07/24/2018   Moderna Sars-Covid-2 Vaccination 12/24/2019, 01/21/2020    Screening Tests Health Maintenance  Topic Date Due   Diabetic kidney evaluation - Urine ACR  Never done   Hepatitis C Screening  Never done   DTaP/Tdap/Td (1 - Tdap) Never done   Zoster Vaccines- Shingrix (1 of 2) Never done   DEXA SCAN  Never done   COVID-19 Vaccine (3 - Moderna risk series) 02/18/2020   OPHTHALMOLOGY EXAM  01/03/2024   FOOT EXAM  03/06/2024   Influenza Vaccine  01/21/2025 (Originally 05/24/2024)   Pneumococcal Vaccine: 50+ Years (1 of 2 - PCV) 04/30/2025 (Originally 01/31/1965)   HEMOGLOBIN A1C  09/24/2024   Diabetic kidney evaluation - eGFR measurement  08/03/2025   Medicare Annual Wellness (AWV)  08/13/2025   Meningococcal B Vaccine  Aged Out   Mammogram  Discontinued    Health Maintenance Health Maintenance Due  Topic Date Due   Diabetic kidney evaluation - Urine ACR  Never done   Hepatitis C Screening  Never done    DTaP/Tdap/Td (1 - Tdap) Never done   Zoster Vaccines- Shingrix (1 of 2) Never done   DEXA SCAN  Never done   COVID-19 Vaccine (3 - Moderna risk series) 02/18/2020   OPHTHALMOLOGY EXAM  01/03/2024   FOOT EXAM  03/06/2024   Health Maintenance Items Addressed: DEXA ordered  Additional Screening:  Vision Screening: Recommended annual ophthalmology exams for early detection of glaucoma and other disorders of the eye. Would you like a referral to an eye doctor? No    Dental Screening: Recommended annual dental exams for proper oral hygiene  Community Resource Referral / Chronic Care Management: CRR required this visit?  No   CCM required this visit?  No   Plan:    I have personally reviewed and noted the following in the patient's chart:   Medical and social history Use  of alcohol , tobacco or illicit drugs  Current medications and supplements including opioid prescriptions. Patient is not currently taking opioid prescriptions. Functional ability and status Nutritional status Physical activity Advanced directives List of other physicians Hospitalizations, surgeries, and ER visits in previous 12 months Vitals Screenings to include cognitive, depression, and falls Referrals and appointments  In addition, I have reviewed and discussed with patient certain preventive protocols, quality metrics, and best practice recommendations. A written personalized care plan for preventive services as well as general preventive health recommendations were provided to patient.   Shelden Raborn, CMA   08/13/2024   After Visit Summary: (Mail) Due to this being a telephonic visit, the after visit summary with patients personalized plan was offered to patient via mail   Notes: Nothing significant to report at this time.

## 2024-08-13 NOTE — Telephone Encounter (Signed)
 Copied from CRM [id number]. Topic: Referral - Request for Referral >> Aug 13, 2024  9:42 AM Ellen Mcmillan wrote: Did the patient discuss referral with their provider in the last year? No (If No - schedule appointment) (If Yes - send message)  Appointment offered? No  Type of order/referral and detailed reason for visit: Dermatologist; spots on legs, Nails breaking down to flesh   Preference of office, provider, location:    If referral order, have you been seen by this specialty before? No (If Yes, this issue or another issue? When? Where?  Can we respond through MyChart? No, please call pt on #[phone]

## 2024-08-13 NOTE — Patient Instructions (Signed)
 [name],  Thank you for taking the time for your Medicare Wellness Visit. I appreciate your continued commitment to your health goals. Please review the care plan we discussed, and feel free to reach out if I can assist you further.  Medicare recommends these wellness visits once per year to help you and your care team stay ahead of potential health issues. These visits are designed to focus on prevention, allowing your provider to concentrate on managing your acute and chronic conditions during your regular appointments.  Please note that Annual Wellness Visits do not include a physical exam. Some assessments may be limited, especially if the visit was conducted virtually. If needed, we may recommend a separate in-person follow-up with your provider.  Ongoing Care  Seeing your primary care provider every 3 to 6 months helps us  monitor your health and provide consistent, personalized care.   Referrals   Osteoporosis Screening An order was placed for you to have your Osteoporosis Screening. Call the number below to schedule that [facility]  [phone]   Recommended Screenings:  Health Maintenance  Topic Date Due   Yearly kidney health urinalysis for diabetes  Never done   Hepatitis C Screening  Never done   DTaP/Tdap/Td vaccine (1 - Tdap) Never done   Zoster (Shingles) Vaccine (1 of 2) Never done   DEXA scan (bone density measurement)  Never done   COVID-19 Vaccine (3 - Moderna risk series) 02/18/2020   Eye exam for diabetics  01/03/2024   Complete foot exam   03/06/2024   Flu Shot  01/21/2025*   Pneumococcal Vaccine for age over 40 (1 of 2 - PCV) 04/30/2025*   Hemoglobin A1C  09/24/2024   Yearly kidney function blood test for diabetes  08/03/2025   Medicare Annual Wellness Visit  08/13/2025   Meningitis B Vaccine  Aged Out   Breast Cancer Screening  Discontinued  *Topic was postponed. The date shown is not the original due date.       08/13/2024    8:04 AM   Advanced Directives  Does Patient Have a Medical Advance Directive? No  Would patient like information on creating a medical advance directive? No - Patient declined    Advance Care Planning is important because it: Ensures you receive medical care that aligns with your values, goals, and preferences. Provides guidance to your family and loved ones, reducing the emotional burden of decision-making during critical moments.  Vision: Annual vision screenings are recommended for early detection of glaucoma, cataracts, and diabetic retinopathy. These exams can also reveal signs of chronic conditions such as diabetes and high blood pressure.  Dental: Annual dental screenings help detect early signs of oral cancer, gum disease, and other conditions linked to overall health, including heart disease and diabetes.  Please see the attached documents for additional preventive care recommendations.

## 2024-08-16 ENCOUNTER — Ambulatory Visit

## 2024-08-23 ENCOUNTER — Other Ambulatory Visit (HOSPITAL_COMMUNITY)

## 2024-08-27 ENCOUNTER — Ambulatory Visit

## 2024-08-28 ENCOUNTER — Encounter: Payer: Self-pay | Admitting: Nutrition

## 2024-08-28 ENCOUNTER — Encounter: Admitting: Nutrition

## 2024-08-28 VITALS — Ht 64.0 in | Wt 183.0 lb

## 2024-08-28 DIAGNOSIS — N184 Chronic kidney disease, stage 4 (severe): Secondary | ICD-10-CM | POA: Insufficient documentation

## 2024-08-28 NOTE — Patient Instructions (Signed)
 Goals  Cut out bacon, sausage, bologna and processed meats No hambuger, fries, pickles or salty foods Focus on more fruits, vegetables and whole grains Choose foods baked, broiled and grilled and not fried. Drink 64 oz of water per day Weight self daily and let MD know if wt goes up 2-3 lbs over night or 5 lbs in a week Avoid snacks unless fruit or vegetables No chips,

## 2024-08-28 NOTE — Progress Notes (Signed)
 edical Nutrition Therapy  Appointment Start time:  1315  Appointment End time:  1400 Primary concerns today: CKD  Referral diagnosis: N18.4 Preferred learning style: No preference  Learning readiness: Ready   NUTRITION ASSESSMENT  78 yr old bfemale referred for CKD and CHF. PCP Leita Longs Creatine 2.09, eGFR 24. Has been treated for CHF.  Pre diabetes A1C 5.9%. Usually eats at home and eats out some-[employer]. Has been trying to avoid using salt but still eating salty foods of bacon, pickles, cheese and bologna. Walks a little when she can if her breathing is ok.  She is willing to work on reducing her processed salt in take and work on eating more fruits, vegetables and whole grains to help her CKD and CHF.  Clinical   Current Outpatient Medications on File Prior to Visit  Medication Sig Dispense Refill   amLODipine  (NORVASC ) 5 MG tablet Take 5 mg by mouth daily.     Blood Pressure KIT 1 Units by Does not apply route daily as needed. 1 kit 0   Blood Pressure Monitoring (BLOOD PRESSURE KIT) DEVI Use to monitor blood pressure daily DX I50.33 1 each 0   carvedilol  (COREG ) 25 MG tablet Take 1 tablet (25 mg total) by mouth 2 (two) times daily with a meal. 60 tablet 5   cetirizine (ZYRTEC) 10 MG tablet Take 10 mg by mouth at bedtime.     fluticasone (FLONASE) 50 MCG/ACT nasal spray Place 2 sprays into both nostrils daily.     furosemide  (LASIX ) 20 MG tablet Take 1 tablet (20 mg total) by mouth daily. 30 tablet 3   lidocaine  (LIDODERM ) 5 % Place 1 patch onto the skin daily. Remove & Discard patch within 12 hours or as directed by MD 30 patch 5   magic mouthwash (lidocaine , diphenhydrAMINE, alum & mag hydroxide) suspension Swish and spit 10 mLs 3 (three) times daily. 360 mL 0   Multiple Vitamin (MULTIVITAMIN) tablet Take 1 tablet by mouth daily.     pantoprazole  (PROTONIX ) 40 MG tablet Take 1 tablet (40 mg total) by mouth daily. 30 tablet 11   simvastatin  (ZOCOR ) 20 MG tablet Take 1  tablet (20 mg total) by mouth daily. 30 tablet 11   ziprasidone  (GEODON ) 80 MG capsule Take 1 capsule (80 mg total) by mouth 2 (two) times daily with a meal. 60 capsule 2   No current facility-administered medications on file prior to visit.     Past Medical History:  Diagnosis Date   CKD (chronic kidney disease), stage IV (HCC) 05/13/2024   Diabetes mellitus without complication (HCC)    HLD (hyperlipidemia)    Hypertension    Ovarian cyst    Schizophrenia (HCC)     Labs:  Lab Results  Component Value Date   HGBA1C 5.9 (H) 03/25/2024      Latest Ref Rng & Units 08/03/2024   12:23 PM 06/20/2024    4:04 PM 05/28/2024   10:58 AM  CMP  Glucose 70 - 99 mg/dL 888  873  894   BUN 8 - 23 mg/dL 32  56  45   Creatinine 0.44 - 1.00 mg/dL 7.91  6.53  7.01   Sodium 135 - 145 mmol/L 142  130  134   Potassium 3.5 - 5.1 mmol/L 4.3  4.6  5.9   Chloride 98 - 111 mmol/L 101  98  98   CO2 22 - 32 mmol/L 26  19  18    Calcium  8.9 - 10.3 mg/dL 10.0  9.5  10.5   Total Protein 6.5 - 8.1 g/dL 7.8     Total Bilirubin 0.0 - 1.2 mg/dL 0.3     Alkaline Phos 38 - 126 U/L 107     AST 15 - 41 U/L 28     ALT 0 - 44 U/L 22      Lipid Panel     Component Value Date/Time   CHOL 190 03/25/2024 1412   TRIG 296 (H) 03/25/2024 1412   HDL 36 (L) 03/25/2024 1412   CHOLHDL 5.3 (H) 03/25/2024 1412   LDLCALC 103 (H) 03/25/2024 1412   LABVLDL 51 (H) 03/25/2024 1412    Notable Signs/Symptoms: None  Lifestyle & Dietary Hx LIves by herself  Eats foods at home.  Estimated daily fluid intake: 40 oz Supplements:  Sleep: 5-8 hrs: 8 pm and get up at 5 am Stress / self-care:  Current average weekly physical activity: ADL  24-Hr Dietary Recall First Meal: Oatmeal Snack:  Second Meal: Hamburger lettuce, tomato,onions, pickles, mustard and ketchup with bun, jello lime,  Coke 12 oz  Snack:  Third Meal:  Chicken alfredo in bag,  1 cup, bologna and cheese sandwich, water Snack: 1/2 of bacon- 3 slices;   sandwich Beverages: water, coke, Mango juice  Estimated Energy Needs Calories: 1200 Carbohydrate: 135g Protein: 90g Fat: 33g   NUTRITION DIAGNOSIS  NB-1.1 Food and nutrition-related knowledge deficit As related to high salt high fat diet high carb diet.  As evidenced by CHF, CKD and Obesity and prediabetes..   NUTRITION INTERVENTION  Nutrition education (E-1) on the following topics:  Nutrition and Diabetes education provided on My Plate, CHO counting, meal planning, portion sizes, timing of meals, avoiding snacks between meals unless having a low blood sugar, taking medications as prescribed, benefits of exercising 30 minutes per day and prevention of  DM.  Kidney Friendly Diet- foods to avoid high in potassium, phoshorus and sodium..Reading food labels.   Handouts Provided Include  Kidney Friendly Diet handouts  Learning Style & Readiness for Change Teaching method utilized: Visual & Auditory  Demonstrated degree of understanding via: Teach Back  Barriers to learning/adherence to lifestyle change: none  Goals Established by Pt Goals  Cut out bacon, sausage, bologna and processed meats No hambuger, fries, pickles or salty foods Focus on more fruits, vegetables and whole grains Choose foods baked, broiled and grilled and not fried. Drink 64 oz of water per day Weight self daily and let MD know if wt goes up 2-3 lbs over night or 5 lbs in a week Avoid snacks unless fruit or vegetables No chips,    MONITORING & EVALUATION Dietary intake, weekly physical activity, and weight in 1 month.  Next Steps  Patient is to work on eating more fresh or frozen fruits, vegetables and whole grains.SABRA

## 2024-08-30 ENCOUNTER — Ambulatory Visit (HOSPITAL_COMMUNITY)
Admission: RE | Admit: 2024-08-30 | Discharge: 2024-08-30 | Disposition: A | Discharge status: Home / Self Care | Source: Ambulatory Visit

## 2024-08-30 DIAGNOSIS — Z780 Asymptomatic menopausal state: Secondary | ICD-10-CM | POA: Diagnosis present

## 2024-09-06 LAB — LAB REPORT - SCANNED
Creatinine, POC: 39.7 mg/dL
EGFR: 22

## 2024-09-07 ENCOUNTER — Other Ambulatory Visit: Payer: Self-pay

## 2024-09-07 ENCOUNTER — Emergency Department (HOSPITAL_COMMUNITY)

## 2024-09-07 ENCOUNTER — Encounter (HOSPITAL_COMMUNITY): Payer: Self-pay

## 2024-09-07 ENCOUNTER — Emergency Department (HOSPITAL_COMMUNITY)
Admission: EM | Admit: 2024-09-07 | Discharge: 2024-09-07 | Disposition: A | Discharge status: Home / Self Care | Attending: Emergency Medicine | Admitting: Emergency Medicine

## 2024-09-07 DIAGNOSIS — R109 Unspecified abdominal pain: Secondary | ICD-10-CM | POA: Diagnosis present

## 2024-09-07 DIAGNOSIS — E119 Type 2 diabetes mellitus without complications: Secondary | ICD-10-CM | POA: Insufficient documentation

## 2024-09-07 DIAGNOSIS — N184 Chronic kidney disease, stage 4 (severe): Secondary | ICD-10-CM | POA: Diagnosis not present

## 2024-09-07 DIAGNOSIS — I129 Hypertensive chronic kidney disease with stage 1 through stage 4 chronic kidney disease, or unspecified chronic kidney disease: Secondary | ICD-10-CM | POA: Insufficient documentation

## 2024-09-07 DIAGNOSIS — R10A1 Flank pain, right side: Secondary | ICD-10-CM

## 2024-09-07 LAB — BASIC METABOLIC PANEL WITH GFR
Anion gap: 13 (ref 5–15)
BUN: 30 mg/dL — ABNORMAL HIGH (ref 8–23)
CO2: 25 mmol/L (ref 22–32)
Calcium: 9.4 mg/dL (ref 8.9–10.3)
Chloride: 98 mmol/L (ref 98–111)
Creatinine, Ser: 2.21 mg/dL — ABNORMAL HIGH (ref 0.44–1.00)
GFR, Estimated: 22 mL/min — ABNORMAL LOW (ref >60)
Glucose, Bld: 112 mg/dL — ABNORMAL HIGH (ref 70–99)
Potassium: 4 mmol/L (ref 3.5–5.1)
Sodium: 136 mmol/L (ref 135–145)

## 2024-09-07 LAB — URINALYSIS, ROUTINE W REFLEX MICROSCOPIC
Bilirubin Urine: NEGATIVE
Glucose, UA: NEGATIVE mg/dL
Hgb urine dipstick: NEGATIVE
Ketones, ur: NEGATIVE mg/dL
Leukocytes,Ua: NEGATIVE
Nitrite: NEGATIVE
Protein, ur: NEGATIVE mg/dL
Specific Gravity, Urine: 1.004 — ABNORMAL LOW (ref 1.005–1.030)
pH: 6 (ref 5.0–8.0)

## 2024-09-07 LAB — CBC
HCT: 32.4 % — ABNORMAL LOW (ref 36.0–46.0)
Hemoglobin: 10.9 g/dL — ABNORMAL LOW (ref 12.0–15.0)
MCH: 32.5 pg (ref 26.0–34.0)
MCHC: 33.6 g/dL (ref 30.0–36.0)
MCV: 96.7 fL (ref 80.0–100.0)
Platelets: 278 K/uL (ref 150–400)
RBC: 3.35 MIL/uL — ABNORMAL LOW (ref 3.87–5.11)
RDW: 12.4 % (ref 11.5–15.5)
WBC: 7.7 K/uL (ref 4.0–10.5)
nRBC: 0 % (ref 0.0–0.2)

## 2024-09-07 MED ORDER — SODIUM CHLORIDE 0.9 % IV BOLUS
1000.0000 mL | Freq: Once | INTRAVENOUS | Status: AC
  Administered 2024-09-07: 1000 mL via INTRAVENOUS

## 2024-09-07 MED ORDER — POLYETHYLENE GLYCOL 3350 17 G PO PACK: 17.0000 g | PACK | Freq: Every day | ORAL | 1 refills | Status: AC

## 2024-09-07 MED ORDER — MORPHINE SULFATE (PF) 4 MG/ML IV SOLN
4.0000 mg | Freq: Once | INTRAVENOUS | Status: AC
  Administered 2024-09-07: 4 mg via INTRAVENOUS
  Filled 2024-09-07: qty 1

## 2024-09-07 MED ORDER — METHOCARBAMOL 500 MG PO TABS: 500.0000 mg | ORAL_TABLET | Freq: Three times a day (TID) | ORAL | 0 refills | Status: AC | PRN

## 2024-09-07 NOTE — ED Triage Notes (Signed)
 Arrives [facility] from home with right sided flank pain x 2 weeks.   Says she has kidney disease but sees her PCP regularly.   Denies urinary sx.

## 2024-09-07 NOTE — ED Notes (Signed)
 Pts bladder scan was 275 ml.

## 2024-09-07 NOTE — ED Provider Notes (Signed)
 [facility] Emergency Department Provider Note MRN:  [id number]  Arrival date & time: 09/07/24     Chief Complaint   Flank Pain   History of Present Illness   Ellen Mcmillan is a 78 y.o. year-old female with a history of diabetes, CKD presenting to the ED with chief complaint of flank pain.  Persistent constant flank pain on the right for the past 2 weeks, not going away, much worse this evening.  Denies any other symptoms, no nausea vomiting, no chest pain, no dysuria hematuria.  Review of Systems  A thorough review of systems was obtained and all systems are negative except as noted in the HPI and PMH.   Patient's Health History    Past Medical History:  Diagnosis Date   CKD (chronic kidney disease), stage IV (HCC) 05/13/2024   Diabetes mellitus without complication (HCC)    HLD (hyperlipidemia)    Hypertension    Ovarian cyst    Schizophrenia [facility])     Past Surgical History:  Procedure Laterality Date   ABDOMINAL HYSTERECTOMY     BIOPSY  10/24/2023   Procedure: BIOPSY;  Surgeon: Cindie Carlin POUR, DO;  Location: [facility] ENDO SUITE;  Service: Endoscopy;;   ESOPHAGOGASTRODUODENOSCOPY (EGD) WITH PROPOFOL  N/A 10/24/2023   Procedure: ESOPHAGOGASTRODUODENOSCOPY (EGD) WITH PROPOFOL ;  Surgeon: Cindie Carlin POUR, DO;  Location: [facility] ENDO SUITE;  Service: Endoscopy;  Laterality: N/A;  1:45 pm, asa 2   FRACTURE SURGERY Right    ankle   TUBAL LIGATION      Family History  Problem Relation Age of Onset   Hypertension Father    Cancer Father        lung   Hypertension Mother    Kidney failure Mother 72   Stroke Mother    Stroke Brother    Hypertension Brother    Hypertension Sister    Other Sister        blood clot   Mental illness Sister    Colon cancer Sister    Mental illness Other    Cancer Other    Stroke Other    Other Son        cardiac arrest    Social History   Socioeconomic History   Marital status: Widowed    Spouse name: Not on file    Number of children: Not on file   Years of education: Not on file   Highest education level: Not on file  Occupational History   Not on file  Tobacco Use   Smoking status: Never   Smokeless tobacco: Never   Tobacco comments:    Smoked when she was 27 and only did it for 3 months  Vaping Use   Vaping status: Never Used  Substance and Sexual Activity   Alcohol  use: No   Drug use: No   Sexual activity: Not Currently    Birth control/protection: Surgical    Comment: hyst  Other Topics Concern   Not on file  Social History Narrative   Not on file   Social Drivers of Health   Financial Resource Strain: Low Risk  (08/13/2024)   Overall Financial Resource Strain (CARDIA)    Difficulty of Paying Living Expenses: Not hard at all  Food Insecurity: No Food Insecurity (08/13/2024)   Hunger Vital Sign    Worried About Running Out of Food in the Last Year: Never true    Ran Out of Food in the Last Year: Never true  Transportation Needs: No Transportation Needs (  08/13/2024)   PRAPARE - Administrator, Civil Service (Medical): No    Lack of Transportation (Non-Medical): No  Physical Activity: Sufficiently Active (08/13/2024)   Exercise Vital Sign    Days of Exercise per Week: 7 days    Minutes of Exercise per Session: 30 min  Recent Concern: Physical Activity - Insufficiently Active (06/04/2024)   Exercise Vital Sign    Days of Exercise per Week: 2 days    Minutes of Exercise per Session: 20 min  Stress: No Stress Concern Present (08/13/2024)   [employer] of Occupational Health - Occupational Stress Questionnaire    Feeling of Stress: Not at all  Social Connections: Moderately Integrated (08/13/2024)   Social Connection and Isolation Panel    Frequency of Communication with Friends and Family: More than three times a week    Frequency of Social Gatherings with Friends and Family: Three times a week    Attends Religious Services: More than 4 times per year     Active Member of Clubs or Organizations: Yes    Attends Banker Meetings: More than 4 times per year    Marital Status: Widowed  Intimate Partner Violence: Not At Risk (08/13/2024)   Humiliation, Afraid, Rape, and Kick questionnaire    Fear of Current or Ex-Partner: No    Emotionally Abused: No    Physically Abused: No    Sexually Abused: No     Physical Exam   Vitals:   09/07/24 0405 09/07/24 0430  BP: (!) 110/97 (!) 127/52  Pulse: 64 (!) 57  Resp: 16 14  Temp:    SpO2: 97% 94%    CONSTITUTIONAL: Well-appearing, NAD NEURO/PSYCH:  Alert and oriented x 3, no focal deficits EYES:  eyes equal and reactive ENT/NECK:  no LAD, no JVD CARDIO: Regular rate, well-perfused, normal S1 and S2 PULM:  CTAB no wheezing or rhonchi GI/GU:  non-distended, non-tender MSK/SPINE:  No gross deformities, no edema SKIN:  no rash, atraumatic   *Additional and/or pertinent findings included in MDM below  Diagnostic and Interventional Summary    EKG Interpretation Date/Time:    Ventricular Rate:    PR Interval:    QRS Duration:    QT Interval:    QTC Calculation:   R Axis:      Text Interpretation:         Labs Reviewed  CBC - Abnormal; Notable for the following components:      Result Value   RBC 3.35 (*)    Hemoglobin 10.9 (*)    HCT 32.4 (*)    All other components within normal limits  BASIC METABOLIC PANEL WITH GFR - Abnormal; Notable for the following components:   Glucose, Bld 112 (*)    BUN 30 (*)    Creatinine, Ser 2.21 (*)    GFR, Estimated 22 (*)    All other components within normal limits  URINALYSIS, ROUTINE W REFLEX MICROSCOPIC - Abnormal; Notable for the following components:   Color, Urine STRAW (*)    Specific Gravity, Urine 1.004 (*)    All other components within normal limits    CT RENAL STONE STUDY  Final Result      Medications  morphine (PF) 4 MG/ML injection 4 mg (4 mg Intravenous Given 09/07/24 0245)  sodium chloride  0.9 % bolus  1,000 mL (0 mLs Intravenous Stopped 09/07/24 0407)     Procedures  /  Critical Care Procedures  ED Course and Medical Decision Making  Initial Impression  and Ddx MSK versus renal pathology such as kidney stone or pyelonephritis.  Well-appearing in no acute distress, reassuring vital signs, soft abdomen.  Past medical/surgical history that increases complexity of ED encounter: CKD  Interpretation of Diagnostics I personally reviewed the Laboratory Testing and my interpretation is as follows: No significant blood count or electrolyte disturbance.  CT largely unremarkable.  Patient Reassessment and Ultimate Disposition/Management     Patient well-appearing again on reassessment.  No numbness or weakness to the legs, no bowel or bladder dysfunction, nothing to suggest myelopathy.  Appropriate for discharge.  Patient management required discussion with the following services or consulting groups:  None  Complexity of Problems Addressed Acute illness or injury that poses threat of life of bodily function  Additional Data Reviewed and Analyzed Further history obtained from: Past medical history and medications listed in the EMR, Prior ED visit notes, and Prior labs/imaging results  Additional Factors Impacting ED Encounter Risk Use of parenteral controlled substances and Consideration of hospitalization  Ozell HERO. Theadore, MD [facility] Emergency Medicine [facility] [email or web address]  Final Clinical Impressions(s) / ED Diagnoses     ICD-10-CM   1. Right flank pain  R10.A1       ED Discharge Orders          Ordered    methocarbamol (ROBAXIN) 500 MG tablet  Every 8 hours PRN        09/07/24 0459    polyethylene glycol (MIRALAX ) 17 g packet  Daily        09/07/24 0459             Discharge Instructions Discussed with and Provided to Patient:     Discharge Instructions      You were evaluated in the Emergency Department and after careful  evaluation, we did not find any emergent condition requiring admission or further testing in the hospital.  Your exam/testing today is overall reassuring.  Symptoms likely due to a muscular strain or spasm.  We also found evidence of constipation on your CT scan.  Use the MiraLAX  daily to help with constipation.  Continue Tylenol  1000 mg as needed every 4-6 hours at home for pain.  Can use the Robaxin at night for more severe pain if having trouble sleeping but use caution as this medication can cause drowsiness.  Please return to the Emergency Department if you experience any worsening of your condition.   Thank you for allowing us  to be a part of your care.       Theadore Ozell HERO, MD 09/07/24 0500

## 2024-09-07 NOTE — Discharge Instructions (Addendum)
 You were evaluated in the Emergency Department and after careful evaluation, we did not find any emergent condition requiring admission or further testing in the hospital.  Your exam/testing today is overall reassuring.  Symptoms likely due to a muscular strain or spasm.  We also found evidence of constipation on your CT scan.  Use the MiraLAX  daily to help with constipation.  Continue Tylenol  1000 mg as needed every 4-6 hours at home for pain.  Can use the Robaxin at night for more severe pain if having trouble sleeping but use caution as this medication can cause drowsiness.  Please return to the Emergency Department if you experience any worsening of your condition.   Thank you for allowing us  to be a part of your care.

## 2024-09-09 ENCOUNTER — Emergency Department (HOSPITAL_COMMUNITY)
Admission: EM | Admit: 2024-09-09 | Discharge: 2024-09-09 | Disposition: A | Discharge status: Home / Self Care | Attending: Emergency Medicine | Admitting: Emergency Medicine

## 2024-09-09 ENCOUNTER — Encounter (HOSPITAL_COMMUNITY): Payer: Self-pay

## 2024-09-09 ENCOUNTER — Other Ambulatory Visit: Payer: Self-pay

## 2024-09-09 DIAGNOSIS — M545 Low back pain, unspecified: Secondary | ICD-10-CM | POA: Diagnosis present

## 2024-09-09 DIAGNOSIS — E1122 Type 2 diabetes mellitus with diabetic chronic kidney disease: Secondary | ICD-10-CM | POA: Diagnosis not present

## 2024-09-09 DIAGNOSIS — I129 Hypertensive chronic kidney disease with stage 1 through stage 4 chronic kidney disease, or unspecified chronic kidney disease: Secondary | ICD-10-CM | POA: Insufficient documentation

## 2024-09-09 DIAGNOSIS — N189 Chronic kidney disease, unspecified: Secondary | ICD-10-CM | POA: Diagnosis not present

## 2024-09-09 MED ORDER — LIDOCAINE 5 % EX PTCH: 1.0000 | MEDICATED_PATCH | CUTANEOUS | 0 refills | Status: AC

## 2024-09-09 MED ORDER — DEXAMETHASONE SOD PHOSPHATE PF 10 MG/ML IJ SOLN
10.0000 mg | Freq: Once | INTRAMUSCULAR | Status: AC
  Administered 2024-09-09: 10 mg via INTRAMUSCULAR

## 2024-09-09 MED ORDER — LIDOCAINE 5 % EX PTCH
1.0000 | MEDICATED_PATCH | CUTANEOUS | Status: DC
  Administered 2024-09-09: 1 via TRANSDERMAL
  Filled 2024-09-09: qty 1

## 2024-09-09 NOTE — ED Triage Notes (Signed)
 Pt BIB [facility] from home with complaints of right flank pain x 2 weeks. Pt was just seen recently here in the ER and was prescribed a muscle relaxer. Pt states that the only thing that works is morphine.

## 2024-09-09 NOTE — ED Provider Notes (Signed)
 [place] EMERGENCY DEPARTMENT AT [facility] Provider Note   CSN: [id number] Arrival date & time: 09/09/24  9084     Patient presents with: Flank Pain   Ellen Mcmillan is a 78 y.o. female with a history including diet-controlled diabetes, hypertension, hyperlipidemia, chronic kidney disease, history of schizophrenia, recent treatment for right sided sciatica presenting with complaint of persistent pain in her right lower back/hip region.  She was seen with similar complaint 2 days ago at which time lab work and CT renal stone study revealed no acute findings.  Her symptoms were felt to be due to musculoskeletal source, she was placed on Robaxin for nightly use, she states it does give her some evening relief but has worsening pain throughout the day.  When she was treated for sciatica she was on a lidocaine  patch which she states was helpful but has not tried this treatment with today's symptoms.  She has no urinary complaints, no fevers or chills, pain is worse with movement, better at rest.  She states she received temporary relief from the morphine shot she received here 2 days ago.  She was also diagnosed with constipation at that visit, she states she had a bowel movement this morning.   The history is provided by the patient.       Prior to Admission medications   Medication Sig Start Date End Date Taking? Authorizing Provider  lidocaine  (LIDODERM ) 5 % Place 1 patch onto the skin daily. Remove & Discard patch within 12 hours or as directed by MD 09/09/24  Yes Davidjames Blansett, Mliss, PA-C  amLODipine  (NORVASC ) 5 MG tablet Take 5 mg by mouth daily.    [provider]  Blood Pressure KIT 1 Units by Does not apply route daily as needed. 07/24/24   Bevely Doffing, FNP  Blood Pressure Monitoring (BLOOD PRESSURE KIT) DEVI Use to monitor blood pressure daily DX I50.33 08/05/24   Bevely Doffing, FNP  carvedilol  (COREG ) 25 MG tablet Take 1 tablet (25 mg total) by mouth 2 (two) times  daily with a meal. 05/15/24   Emokpae, Courage, MD  cetirizine (ZYRTEC) 10 MG tablet Take 10 mg by mouth at bedtime. 07/25/22   [provider]  fluticasone (FLONASE) 50 MCG/ACT nasal spray Place 2 sprays into both nostrils daily. 05/16/24   [provider]  furosemide  (LASIX ) 20 MG tablet Take 1 tablet (20 mg total) by mouth daily. 05/28/24   Bevely Doffing, FNP  magic mouthwash (lidocaine , diphenhydrAMINE, alum & mag hydroxide) suspension Swish and spit 10 mLs 3 (three) times daily. 08/03/24   Suellen Cantor A, PA-C  methocarbamol (ROBAXIN) 500 MG tablet Take 1 tablet (500 mg total) by mouth every 8 (eight) hours as needed for muscle spasms. 09/07/24   Theadore Ozell CHRISTELLA, MD  Multiple Vitamin (MULTIVITAMIN) tablet Take 1 tablet by mouth daily.    [provider]  pantoprazole  (PROTONIX ) 40 MG tablet Take 1 tablet (40 mg total) by mouth daily. 05/15/24 05/15/25  Pearlean Manus, MD  polyethylene glycol (MIRALAX ) 17 g packet Take 17 g by mouth daily. 09/07/24   Theadore Ozell CHRISTELLA, MD  simvastatin  (ZOCOR ) 20 MG tablet Take 1 tablet (20 mg total) by mouth daily. 03/25/24   Bevely Doffing, FNP  ziprasidone  (GEODON ) 80 MG capsule Take 1 capsule (80 mg total) by mouth 2 (two) times daily with a meal. 06/13/24   Bevely Doffing, FNP    Allergies: Bee pollen    Review of Systems  Constitutional:  Negative for chills  and fever.  Respiratory:  Negative for shortness of breath.   Cardiovascular:  Negative for chest pain and leg swelling.  Gastrointestinal:  Negative for abdominal distention, abdominal pain, constipation and diarrhea.  Genitourinary:  Negative for difficulty urinating, dysuria, flank pain, frequency, hematuria and urgency.  Musculoskeletal:  Positive for back pain. Negative for gait problem, joint swelling and myalgias.  Skin:  Negative for rash.  Neurological:  Negative for weakness and numbness.    Updated Vital Signs BP (!) 150/55 (BP Location: Left Arm)   Pulse 76    Temp 97.8 F (36.6 C) (Oral)   Resp 15   Ht 5' 4 (1.626 m)   Wt 81.6 kg   SpO2 95%   BMI 30.88 kg/m   Physical Exam Vitals and nursing note reviewed.  Constitutional:      Appearance: She is well-developed.  HENT:     Head: Normocephalic.  Eyes:     Conjunctiva/sclera: Conjunctivae normal.  Cardiovascular:     Rate and Rhythm: Normal rate.     Pulses: Normal pulses.     Comments: Pedal pulses normal. Pulmonary:     Effort: Pulmonary effort is normal.  Abdominal:     General: Bowel sounds are normal. There is no distension.     Palpations: Abdomen is soft. There is no mass.  Musculoskeletal:        General: Normal range of motion.     Cervical back: Normal range of motion and neck supple.     Lumbar back: Tenderness present. No swelling, edema or spasms.  Skin:    General: Skin is warm and dry.  Neurological:     General: No focal deficit present.     Mental Status: She is alert and oriented to person, place, and time.     Sensory: No sensory deficit.     Motor: No tremor or atrophy.     Gait: Gait normal.     Deep Tendon Reflexes:     Reflex Scores:      Patellar reflexes are 2+ on the right side and 2+ on the left side.    Comments: No strength deficit noted in hip and knee flexor and extensor muscle groups.  Ankle flexion and extension intact. Full strength in hip, thigh and ankle resisted motion.      (all labs ordered are listed, but only abnormal results are displayed) Labs Reviewed - No data to display  EKG: None  Radiology: No results found.   Procedures   Medications Ordered in the ED  dexamethasone (DECADRON) injection 10 mg (has no administration in time range)  lidocaine  (LIDODERM ) 5 % 1 patch (has no administration in time range)                                    Medical Decision Making Patient presenting with persistent pain in her right lower back, her pain is reproducible with movement and with palpation along her right lumbar  posterior pelvic rim region.  There are no lesions, rash.  She has no midline lumbar tenderness.  She has no weakness in her lower extremities.  She was encouraged to continue using the Robaxin, I have given her an IM dose of Decadron and will put her back on lidocaine  patches which was helpful with her recent sciatica.  She denies radiation into her right leg at this time, also no urinary complaints.  No evidence for infection  or cauda equina on today's exam or by history.  CT imaging and labs from patient's ED visit 48 hours ago reviewed, no indication for repeat testing at this time.  Risk Prescription drug management. Decision regarding hospitalization.        Final diagnoses:  Acute right-sided low back pain without sciatica    ED Discharge Orders          Ordered    lidocaine  (LIDODERM ) 5 %  Every 24 hours        09/09/24 1030               Birdena Clarity, PA-C 09/09/24 1033    Melvenia Motto, MD 09/09/24 1547

## 2024-09-09 NOTE — Discharge Instructions (Signed)
 Your exam today is reassuring, I suspect the soft tissues including the muscles, possibly a nerve in your lower back is causing your pain symptoms.  I do recommend you continuing using the Robaxin you were prescribed here 2 days ago.  I have also prescribed you a Lidoderm  patch, apply a new patch daily per instructions at the site of your pain.  Plan a recheck by your primary doctor in a week if your symptoms are not improving with this plan.

## 2024-09-09 NOTE — ED Notes (Signed)
 [facility] to [facility] patient and take home.

## 2024-09-16 ENCOUNTER — Ambulatory Visit: Admitting: Podiatry

## 2024-09-16 ENCOUNTER — Encounter: Payer: Self-pay | Admitting: Podiatry

## 2024-09-16 DIAGNOSIS — B351 Tinea unguium: Secondary | ICD-10-CM

## 2024-09-16 DIAGNOSIS — M79675 Pain in left toe(s): Secondary | ICD-10-CM

## 2024-09-16 DIAGNOSIS — E1151 Type 2 diabetes mellitus with diabetic peripheral angiopathy without gangrene: Secondary | ICD-10-CM | POA: Diagnosis not present

## 2024-09-16 DIAGNOSIS — L84 Corns and callosities: Secondary | ICD-10-CM

## 2024-09-16 DIAGNOSIS — M79674 Pain in right toe(s): Secondary | ICD-10-CM

## 2024-09-16 NOTE — Progress Notes (Unsigned)
    Subjective:  Patient ID: Ellen Mcmillan, female    DOB: October 21, 1946,  MRN: [id number]  Ellen Mcmillan presents to clinic today for:  Chief Complaint  Patient presents with   [facility]     RFC Non diebetic tonail trim. A1C 5.9. Thick onychomycosis nails.    Patient notes nails are thick and elongated, causing pain in shoe gear when ambulating.  She has a callus on the right bunion.  She did review and sign a Medicaid ABN for the callus shaving today.  She would like to proceed with surgical planning to have just the right bunion fixed.  She does not want any other toes work done.  PCP is Bevely Doffing, FNP.  Last seen 08/05/2024  Past Medical History:  Diagnosis Date   CKD (chronic kidney disease), stage IV (HCC) 05/13/2024   Diabetes mellitus without complication (HCC)    HLD (hyperlipidemia)    Hypertension    Ovarian cyst    Schizophrenia (HCC)    Allergies  Allergen Reactions   Bee Pollen Other (See Comments)    Seasonal allergies    Objective:  Ellen Mcmillan is a pleasant 78 y.o. female in NAD. AAO x 3.  Vascular Examination: Patient has palpable DP pulse, absent PT pulse bilateral.  Delayed capillary refill bilateral toes.  Sparse digital hair bilateral.  Proximal to distal cooling WNL bilateral.    Dermatological Examination: Interspaces are clear with no open lesions noted bilateral.  Skin is shiny and atrophic bilateral.  Nails are 3-24mm thick, with yellowish/brown discoloration, subungual debris and distal onycholysis x10.  There is pain with compression of nails x10.  There are hyperkeratotic lesions noted plantar medial right first MPJ/bunion.     Latest Ref Rng & Units 03/25/2024    2:12 PM  Hemoglobin A1C  Hemoglobin-A1c 4.8 - 5.6 % 5.9    Patient qualifies for at-risk foot care because of diabetes with PVD.  Assessment/Plan: 1. Pain due to onychomycosis of toenails of both feet    Mycotic nails x10 were sharply debrided with sterile nail nippers and power  debriding burr to decrease bulk and length.  Hyperkeratotic lesions on medial right bunion was  shaved with #312 blade.  She did review and sign a Medicaid ABN for callus shaving today.  Will get her scheduled with Dr. Lamount for surgical consult for the right bunion.   Awanda CHARM Imperial, DPM, FACFAS [facility]     [address] [place], [place] [address]                Office [phone]  Fax [phone]

## 2024-09-17 ENCOUNTER — Telehealth: Payer: Self-pay | Admitting: Podiatry

## 2024-09-17 NOTE — Telephone Encounter (Signed)
 Patient is requesting information on instructions to prepare for surgery. Please contact patient. [phone]

## 2024-09-18 NOTE — Telephone Encounter (Signed)
 Spoke with patient. Patient needs an appointment with Dr. Lamount. Please contact patient and schedule with Dr. Lamount per Dr. Dolan last note.   Thank you  so much!

## 2024-09-23 ENCOUNTER — Telehealth: Payer: Self-pay

## 2024-09-23 ENCOUNTER — Telehealth: Payer: Self-pay | Admitting: Nutrition

## 2024-09-23 NOTE — Telephone Encounter (Signed)
 TC from her stating that when she drinks her 64 oz of water, she has diarrhea. Advised to reduce water intake to 40 oz. She reports that her visit with her CKD Dr. Dayla week was very good. She reports that she is eating more fruits, and vegetables and avoiding foods too high in potassium. She has cut out processed foods. She verbalized understanding. Unable to see lab results from her CKD Appt.

## 2024-09-23 NOTE — Telephone Encounter (Unsigned)
 Copied from CRM #[id number]. Topic: Clinical - Prescription Issue >> Sep 23, 2024  4:20 PM Lauren C wrote: Pt calling back to check on request from earlier. I tried to see what medications she needed, she states she needs all of them again and said it is 13 medications she needs. I listed some of the things on her chart like lidocaine /mouthwash/multivitamin and she said not those. She is mostly concerned about blood pressure medication and that missing it will cause a stroke. Requesting call back at [phone] before EOD

## 2024-09-23 NOTE — Telephone Encounter (Signed)
 Copied from CRM #[id number]. Topic: Clinical - Prescription Issue >> Sep 23, 2024  1:49 PM Terri G wrote: Reason for CRM: Patient stated she ran out of her blood pressure medication and pharmacy stated they will have it ready by Wednesday but her pcp can call them to get it faster. And she also stated she ran out of her all her medications and she need, and she wants someone to call her when it's ready. I asked her the name of the medications and she yelled  ALL OF THEM  Callback number [phone]

## 2024-09-24 ENCOUNTER — Inpatient Hospital Stay: Payer: Self-pay

## 2024-09-24 NOTE — Telephone Encounter (Signed)
 Will Discuss at appointment on what needs to be refilled and what does not

## 2024-10-01 ENCOUNTER — Encounter: Admitting: Nutrition

## 2024-10-01 ENCOUNTER — Encounter: Payer: Self-pay | Admitting: Nutrition

## 2024-10-01 VITALS — Ht 63.0 in | Wt 177.6 lb

## 2024-10-01 DIAGNOSIS — N184 Chronic kidney disease, stage 4 (severe): Secondary | ICD-10-CM | POA: Diagnosis present

## 2024-10-01 NOTE — Patient Instructions (Signed)
 Goals Keep up the great job! Keep eating foods from a garden. Keep walking 2 times per day Keep drinking your water. Take medications as prescribed.

## 2024-10-01 NOTE — Progress Notes (Signed)
 edical Nutrition Therapy  Appointment Start time:  1500   Appointment End time:  1530 Primary concerns today: CKD  Referral diagnosis: N18.4 Preferred learning style: No preference  Learning readiness: Ready   NUTRITION ASSESSMENT CKD and CHF Follow up Making excellent progress with her diet. Has been cutting back on sugar and salty foods. Cut out processed foods and is eating more fresh fruits, vegetables and whole grains. Eating some nuts. . Saw Dr. Torrance and will have to start BP medications soon. Sees him in 2 days.  BP in office was 168/64 mg/dl in my office today. Has been cooking foods at home Uses Mrs. Dash. Keeping a food diary and shows she has made excellent progress with her food chocies. Digestion is better. Feels like her clothes are fitting looser. Feels better. Less swelling. Has been walking daily 20 minutes twice a day Lost 6 lbs. Will follow up on lab results to see if we need to avoid foods high in phosphorus like her nuts she eats at times. She has been cutting down on foods high in potassium. Prediabetes A1C 5.9%. She is on Lasix  20 mg daily. Recent labs: 11./25  Creat 2.29 mg/dl Phos 4.8 mg/dl eGFR 22, up from 19.   Goals set previously Cut out bacon, sausage, bologna and processed meats-done No hambuger, fries, pickles or salty foods-done Focus on more fruits, vegetables and whole grains-done Choose foods baked, broiled and grilled and not fried.-done Drink 64 oz of water per day-done Weight self daily and let MD know if wt goes up 2-3 lbs over night or 5 lbs in a week Avoid snacks unless fruit or vegetables- done No chips, -done Clinical Wt Readings from Last 3 Encounters:  10/01/24 177 lb 9.6 oz (80.6 kg)  09/09/24 179 lb 14.3 oz (81.6 kg)  09/07/24 180 lb (81.6 kg)   Ht Readings from Last 3 Encounters:  10/01/24 5' 3 (1.6 m)  09/09/24 5' 4 (1.626 m)  09/07/24 5' 4 (1.626 m)   Body mass index is 31.46 kg/m. @BMIFA @ Facility age limit for  growth %iles is 20 years. Facility age limit for growth %iles is 20 years.    Current Outpatient Medications on File Prior to Visit  Medication Sig Dispense Refill   amLODipine  (NORVASC ) 5 MG tablet Take 5 mg by mouth daily.     Blood Pressure KIT 1 Units by Does not apply route daily as needed. 1 kit 0   Blood Pressure Monitoring (BLOOD PRESSURE KIT) DEVI Use to monitor blood pressure daily DX I50.33 1 each 0   carvedilol  (COREG ) 25 MG tablet Take 1 tablet (25 mg total) by mouth 2 (two) times daily with a meal. 60 tablet 5   cetirizine (ZYRTEC) 10 MG tablet Take 10 mg by mouth at bedtime.     fluticasone (FLONASE) 50 MCG/ACT nasal spray Place 2 sprays into both nostrils daily.     furosemide  (LASIX ) 20 MG tablet Take 1 tablet (20 mg total) by mouth daily. 30 tablet 3   lidocaine  (LIDODERM ) 5 % Place 1 patch onto the skin daily. Remove & Discard patch within 12 hours or as directed by MD 30 patch 0   magic mouthwash (lidocaine , diphenhydrAMINE, alum & mag hydroxide) suspension Swish and spit 10 mLs 3 (three) times daily. 360 mL 0   methocarbamol  (ROBAXIN ) 500 MG tablet Take 1 tablet (500 mg total) by mouth every 8 (eight) hours as needed for muscle spasms. 30 tablet 0   Multiple Vitamin (MULTIVITAMIN) tablet Take  1 tablet by mouth daily.     pantoprazole  (PROTONIX ) 40 MG tablet Take 1 tablet (40 mg total) by mouth daily. 30 tablet 11   polyethylene glycol (MIRALAX ) 17 g packet Take 17 g by mouth daily. 30 each 1   simvastatin  (ZOCOR ) 20 MG tablet Take 1 tablet (20 mg total) by mouth daily. 30 tablet 11   ziprasidone  (GEODON ) 80 MG capsule Take 1 capsule (80 mg total) by mouth 2 (two) times daily with a meal. 60 capsule 2   No current facility-administered medications on file prior to visit.     Past Medical History:  Diagnosis Date   CKD (chronic kidney disease), stage IV (HCC) 05/13/2024   Diabetes mellitus without complication (HCC)    HLD (hyperlipidemia)    Hypertension    Ovarian  cyst    Schizophrenia (HCC)     Labs:  Lab Results  Component Value Date   HGBA1C 5.9 (H) 03/25/2024      Latest Ref Rng & Units 09/07/2024    2:42 AM 08/03/2024   12:23 PM 06/20/2024    4:04 PM  CMP  Glucose 70 - 99 mg/dL 887  888  873   BUN 8 - 23 mg/dL 30  32  56   Creatinine 0.44 - 1.00 mg/dL 7.78  7.91  6.53   Sodium 135 - 145 mmol/L 136  142  130   Potassium 3.5 - 5.1 mmol/L 4.0  4.3  4.6   Chloride 98 - 111 mmol/L 98  101  98   CO2 22 - 32 mmol/L 25  26  19    Calcium  8.9 - 10.3 mg/dL 9.4  89.9  9.5   Total Protein 6.5 - 8.1 g/dL  7.8    Total Bilirubin 0.0 - 1.2 mg/dL  0.3    Alkaline Phos 38 - 126 U/L  107    AST 15 - 41 U/L  28    ALT 0 - 44 U/L  22     Lipid Panel     Component Value Date/Time   CHOL 190 03/25/2024 1412   TRIG 296 (H) 03/25/2024 1412   HDL 36 (L) 03/25/2024 1412   CHOLHDL 5.3 (H) 03/25/2024 1412   LDLCALC 103 (H) 03/25/2024 1412   LABVLDL 51 (H) 03/25/2024 1412    Notable Signs/Symptoms: None  Lifestyle & Dietary Hx LIves by herself  Eats foods at home.  Estimated daily fluid intake: 40 oz Supplements:  Sleep: 5-8 hrs: 8 pm and get up at 5 am Stress / self-care:  Current average weekly physical activity: ADL  24-Hr Dietary Recall First Meal: Great grains, fruit, water,  Lunch: salad, beans, rice, fruit, water  Third Meal:  Meatloaf, green beans, cabbage, black eye peas, tomatoes, beets and whole wheat bread, fruit,  Nuts, Beverages: water,    Estimated Energy Needs Calories: 1200 Carbohydrate: 135g Protein: 90g Fat: 33g   NUTRITION DIAGNOSIS  NB-1.1 Food and nutrition-related knowledge deficit As related to high salt high fat diet high carb diet.  As evidenced by CHF, CKD and Obesity and prediabetes..   NUTRITION INTERVENTION  Nutrition education (E-1) on the following topics:  Nutrition and Diabetes education provided on My Plate, CHO counting, meal planning, portion sizes, timing of meals, avoiding snacks between  meals unless having a low blood sugar, taking medications as prescribed, benefits of exercising 30 minutes per day and prevention of  DM.  Kidney Friendly Diet- foods to avoid high in potassium, phoshorus and sodium..Reading food labels.  Handouts Provided Include  Kidney Friendly Diet handouts  Learning Style & Readiness for Change Teaching method utilized: Visual & Auditory  Demonstrated degree of understanding via: Teach Back  Barriers to learning/adherence to lifestyle change: none  Goals Established by Pt  Keep up the great job! Keep eating foods from a garden. Keep walking 2 times per day Keep drinking your water. Take medications as prescribed.    MONITORING & EVALUATION Dietary intake, weekly physical activity, and weight in 4 month.  Next Steps  Patient is to work on eating more fresh or frozen fruits, vegetables and whole grains.SABRA

## 2024-10-02 ENCOUNTER — Encounter: Admitting: Nutrition

## 2024-10-28 ENCOUNTER — Encounter: Payer: Self-pay | Admitting: Podiatry

## 2024-10-28 ENCOUNTER — Ambulatory Visit: Admitting: Podiatry

## 2024-10-28 ENCOUNTER — Ambulatory Visit

## 2024-10-28 DIAGNOSIS — M21611 Bunion of right foot: Secondary | ICD-10-CM

## 2024-10-28 DIAGNOSIS — I739 Peripheral vascular disease, unspecified: Secondary | ICD-10-CM

## 2024-10-28 NOTE — Progress Notes (Signed)
 "  Subjective:  Patient ID: Ellen Mcmillan, female    DOB: March 27, 1946,  MRN: [id number]  Chief Complaint  Patient presents with   Foot Pain    Bunion is very sore hurts to touch. R 2nd toe end is sore also.  Prediabetic. No anti coag    Discussed the use of AI scribe software for clinical note transcription with the patient, who gave verbal consent to proceed.  History of Present Illness Ellen Mcmillan is a 79 year old female with heart failure and chronic kidney disease who presents for evaluation of a chronic, progressive right foot bunion causing pain and difficulty with shoe wear.  She reports several years of progressive right foot bunion deformity with persistent pain localized over the bunion. Pain worsens with regular shoes, and she now tolerates only soft, wide footwear. Pain is not triggered by hallux motion. She also has pain and callus at the distal second toe.  She has tried topical arthritis creams and several corticosteroid injections without meaningful or lasting relief. She has not had prior bunion surgery.  She has chronic lower extremity edema that worsens with dietary salt or sugar and further limits footwear options. She also notes a persistent cold sensation in her feet.      Objective:    Physical Exam EXTREMITIES: Moderate to severe bunion deformity on the right foot with lateral drift and deviation of the first metatarsophalangeal joint. Decreased range of motion in the first metatarsophalangeal joint with 20-30 degrees of dorsiflexion. Callus present on the second toe. Pitting edema in bilateral lower extremities. MUSCULOSKELETAL: Muscle strength 5/5 in all major muscle groups. DP and PT pulses faintly palpable, difficult to palpate due to edema.  +1+2 pitting edema present.  Cap refill approximately 3 seconds to digits. Light touch sensation intact No open wounds noted bilaterally.  Callus present distal right second toe.   No images are attached to the  encounter.    Results Right foot radiographs 3 views weightbearing 10/28/2024 Hardware in place right ankle noted from prior ankle ORIF.  Pes planus foot type.  No acute fractures.  Increased IM angle approximately 18 degrees.  Tibial sesamoid rotation 6.  Large medial eminence.  Increased HAV angle with lateral subluxation of the first toe.  Mild tailor's bunion deformity noted as well.   Assessment:   1. Bunion, right foot   2. PAD (peripheral artery disease)      Plan:  Patient was evaluated and treated and all questions answered.  Assessment and Plan Assessment & Plan Moderate to severe bunion deformity of right foot with subluxation of first metatarsophalangeal joint Chronic right foot bunion with lateral subluxation causing pain, limited motion, and callus. Does have significant co morbidities and discussed risk associated with this.  It sounds as though patient has tried various conservative measures and has difficulty tolerating shoe gear.  Discussed surgical correction, preferring minimally invasive approach due to comorbidities, despite increased risk. Explained surgical risks and benefits of minimally invasive versus fusion techniques. Preoperative vascular assessment required. - Debrided callus over right second toe as a courtesy today. - Provided bunion shield and pads, recommend continuing with accommodative padding and shoe modifications. - Recommended wider fitting shoes and professional measurement. - Advised topical diclofenac (Voltaren gel) use, considering renal disease. - Discussed conservative management: extra wide, soft shoes, bunion shields. - Discussed surgical options: minimally invasive correction and fusion, preferring minimally invasive if she does fail conservative measures. Did discuss likely arthritis of the joint which may limit  outcomes with MIS correction as well. - Explained need for preoperative vascular assessment. - Reviewed recovery timelines and  postoperative care for both procedures. - Advised follow-up in 4-6 weeks to reassess symptoms and review vascular assessment results. - Will discuss surgical treatment in more detail at follow up if appropriate.      Return in about 4 weeks (around 11/25/2024) for bunion.    "

## 2024-11-09 ENCOUNTER — Ambulatory Visit: Payer: Self-pay

## 2024-11-12 ENCOUNTER — Other Ambulatory Visit: Payer: Self-pay

## 2024-11-12 DIAGNOSIS — E1159 Type 2 diabetes mellitus with other circulatory complications: Secondary | ICD-10-CM

## 2024-11-12 MED ORDER — CARVEDILOL 25 MG PO TABS: 25.0000 mg | ORAL_TABLET | Freq: Two times a day (BID) | ORAL | 5 refills | Status: AC

## 2024-11-22 ENCOUNTER — Ambulatory Visit

## 2024-11-26 ENCOUNTER — Ambulatory Visit: Payer: Self-pay

## 2024-11-26 ENCOUNTER — Telehealth: Payer: Self-pay

## 2024-11-26 NOTE — Telephone Encounter (Signed)
 Added to waiting list for sooner appt

## 2024-11-26 NOTE — Telephone Encounter (Signed)
 FYI Only or Action Required?: Action required by provider: request for appointment.  Patient was last seen in primary care on 08/05/2024 by Bevely Doffing, FNP.  Called Nurse Triage reporting Hypertension.  Symptoms began ongoing.  Interventions attempted: Prescription medications: carvedilol  and additional medications prescribed by kidney doctor .  Symptoms are: stable.  Triage Disposition: See PCP When Office is Open (Within 3 Days)  Patient/caregiver understands and will follow disposition?: Yes, but will wait                                  1. BLOOD PRESSURE: What is your blood pressure? Did you take at least two measurements 5 minutes apart?     Reports that BP has been in the 160s everyday even though she takes multiple BP medications, patient unable to give exact timeframe 4. HISTORY: Do you have a history of high blood pressure?     Yes 5. MEDICINES: Are you taking any medicines for blood pressure? Have you missed any doses recently?     Carvedilol  25 MG and additional medications prescribed by kidney doctor, per patient 6. OTHER SYMPTOMS: Do you have any symptoms? (e.g., blurred vision, chest pain, difficulty breathing, headache, weakness)     Occasional weakness that has been ongoing Denies: chest pain, difficulty breathing, headache, blurred vision    This RN advised in-person evaluation for symptoms. No availability in PCP office until March. Patient would like to be seen in PCP office for symptoms. Please advise on sooner availability. Patient would appreciate a call back.   Reason for Disposition  Systolic BP >= 160 OR Diastolic >= 100  Protocols used: Blood Pressure - High-A-AH   Copied from CRM #[id number]. Topic: Clinical - Medical Advice >> Nov 26, 2024 12:57 PM Avram MATSU wrote: Reason for CRM: patient would like to know if its safe to shovel snow with chronic heart failure and kidney diease. Patient stated she has  high blood pressure and wanted to know how come she can't  get enough  medication to lower it.  Please advise [phone]

## 2024-11-26 NOTE — Telephone Encounter (Signed)
 Patient advised

## 2024-11-26 NOTE — Telephone Encounter (Signed)
 Should be okay for her to shovel some snow.  Advise to stop if she should start to experience any chest pain or SOB.

## 2024-11-28 ENCOUNTER — Ambulatory Visit (HOSPITAL_COMMUNITY)
Admission: RE | Admit: 2024-11-28 | Discharge: 2024-11-28 | Disposition: A | Source: Ambulatory Visit | Attending: Vascular Surgery

## 2024-11-28 DIAGNOSIS — I739 Peripheral vascular disease, unspecified: Secondary | ICD-10-CM

## 2024-11-28 LAB — VAS US ABI WITH/WO TBI
Left ABI: 0.83
Right ABI: 0.77

## 2024-12-02 ENCOUNTER — Ambulatory Visit: Admitting: Podiatry

## 2024-12-17 ENCOUNTER — Ambulatory Visit: Admitting: Podiatry

## 2024-12-31 ENCOUNTER — Ambulatory Visit

## 2025-01-30 ENCOUNTER — Encounter: Admitting: Nutrition

## 2025-04-07 ENCOUNTER — Ambulatory Visit: Admitting: Dermatology

## 2025-04-08 ENCOUNTER — Ambulatory Visit: Admitting: Dermatology

## 2025-08-18 ENCOUNTER — Ambulatory Visit
